# Patient Record
Sex: Female | Born: 1990 | Race: White | Hispanic: No | Marital: Married | Smoking: Never smoker
Health system: Southern US, Community
[De-identification: ages and names within clinical notes are randomized; demographics above are authoritative.]

## PROBLEM LIST (undated history)

## (undated) DIAGNOSIS — C719 Malignant neoplasm of brain, unspecified: Secondary | ICD-10-CM

## (undated) DIAGNOSIS — B2790 Infectious mononucleosis, unspecified without complication: Secondary | ICD-10-CM

## (undated) DIAGNOSIS — R55 Syncope and collapse: Secondary | ICD-10-CM

## (undated) DIAGNOSIS — R569 Unspecified convulsions: Secondary | ICD-10-CM

## (undated) HISTORY — DX: Unspecified convulsions: R56.9

## (undated) HISTORY — DX: Syncope and collapse: R55

## (undated) HISTORY — DX: Infectious mononucleosis, unspecified without complication: B27.90

## (undated) HISTORY — PX: TONSILLECTOMY: SHX5217

## (undated) HISTORY — DX: Malignant neoplasm of brain, unspecified: C71.9

## (undated) HISTORY — PX: CRANIOTOMY: SHX93

---
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
## 2012-02-03 ENCOUNTER — Encounter: Payer: Self-pay | Admitting: Family

## 2012-02-03 ENCOUNTER — Ambulatory Visit (INDEPENDENT_AMBULATORY_CARE_PROVIDER_SITE_OTHER): Payer: BC Managed Care – PPO | Admitting: Family

## 2012-02-03 VITALS — BP 120/80 | HR 100 | Temp 97.9°F | Resp 16 | Wt 190.0 lb

## 2012-02-03 DIAGNOSIS — R55 Syncope and collapse: Secondary | ICD-10-CM

## 2012-02-03 NOTE — Patient Instructions (Signed)
Syncope You have had a fainting (syncopal) spell. A fainting episode is a sudden, short-lived loss of consciousness. It results in complete recovery. It occurs because there has been a temporary shortage of oxygen and/or sugar (glucose) to the brain. CAUSES   Blood pressure pills and other medications that may lower blood pressure below normal. Sudden changes in posture (sudden standing).   Over-medication. Take your medications as directed.   Standing too long. This can cause blood to pool in the legs.   Seizure disorders.   Low blood sugar (hypoglycemia) of diabetes. This more commonly causes coma.   Bearing down to go to the bathroom. This can cause your blood pressure to rise suddenly. Your body compensates by making the blood pressure too low when you stop bearing down.   Hardening of the arteries where the brain temporarily does not receive enough blood.   Irregular heart beat and circulatory problems.   Fear, emotional distress, injury, sight of blood, or illness.  Your caregiver will send you home if the syncope was from non-worrisome causes (benign). Depending on your age and health, you may stay to be monitored and observed. If you return home, have someone stay with you if your caregiver feels that is desirable. It is very important to keep all follow-up referrals and appointments in order to properly manage this condition. This is a serious problem which can lead to serious illness and death if not carefully managed.  WARNING: Do not drive or operate machinery until your caregiver feels that it is safe for you to do so. SEEK IMMEDIATE MEDICAL CARE IF:   You have another fainting episode or faint while lying or sitting down. DO NOT DRIVE YOURSELF. Call 911 if no other help is available.   You have chest pain, are feeling sick to your stomach (nausea), vomiting or abdominal pain.   You have an irregular heartbeat or one that is very fast (pulse over 120 beats per minute).    You have a loss of feeling in some part of your body or lose movement in your arms or legs.   You have difficulty with speech, confusion, severe weakness, or visual problems.   You become sweaty and/or feel light headed.  Make sure you are rechecked as instructed. Document Released: 10/10/2005 Document Revised: 09/29/2011 Document Reviewed: 05/31/2007 Exit[employer] Patient Information 2012 [employer]. 

## 2012-02-03 NOTE — Progress Notes (Signed)
  Subjective:    Patient ID: Stacy Gutierrez, female    DOB: [birth year], 21 y.o.   MRN: [id number]  HPI  Pt is an 21 yr old female who presents today s/p syncopal event.  Reports that she woke this AM with nausea.  Denies associated vomiting or fever. Denies nasal congestion.  Does feel "a little achey."  Tol po's.  Reports that this episode occurred when she was seated in class.  She slumped to her right per hx and was passed out for a few seconds only.      Review of Systems See HPI  Past Medical History  Diagnosis Date  . Vasovagal syncope   . Mononucleosis 01/2010    History   Social History  . Marital Status: Single    Spouse Name: N/A    Number of Children: 0  . Years of Education: N/A   Occupational History  . Student    Social History Main Topics  . Smoking status: Never Smoker   . Smokeless tobacco: Never Used  . Alcohol Use: No  . Drug Use: Not on file  . Sexually Active: No   Other Topics Concern  . Not on file   Social History Narrative   Occupation: [employer] [employer], golfthird degree black belt in [employer]    Past Surgical History  Procedure Date  . Tonsillectomy   . Tibia fracture surgery 03/2010    Tibial Fracture-left leg--plate/screw--Dr. Samuel Bouche    Family History  Problem Relation Age of Onset  . Coronary artery disease Other   . Diabetes Other   . Hyperlipidemia Other   . Stroke Other     No Known Allergies  Current Outpatient Prescriptions on File Prior to Visit  Medication Sig Dispense Refill  . CLARAVIS 40 MG capsule Take 40 mg by mouth daily.      . fluticasone (FLONASE) 50 MCG/ACT nasal spray Place 2 sprays into the nose daily.  16 g  1  . loratadine-pseudoephedrine (CLARITIN-D 12-HOUR) 5-120 MG per tablet Take 1 tablet by mouth 2 (two) times daily.        BP 120/80  Pulse 100  Temp(Src) 97.9 F (36.6 C) (Oral)  Resp 16  Wt 190 lb (86.183 kg)  SpO2 99%  LMP  01/20/2012       Objective:   Physical Exam  Constitutional: She appears well-developed and well-nourished. No distress.  HENT:  Head: Normocephalic and atraumatic.  Cardiovascular: Normal rate and regular rhythm.   No murmur heard. Pulmonary/Chest: Effort normal and breath sounds normal.  Abdominal: Soft. Bowel sounds are normal. She exhibits no distension and no mass. There is no tenderness. There is no rebound and no guarding.  Psychiatric: She has a normal mood and affect. Her behavior is normal. Judgment and thought content normal.          Assessment & Plan:

## 2012-02-03 NOTE — Assessment & Plan Note (Signed)
Previous episode back in 2010 when she was watching an film about intestines.  EKG is personally reviewed and it shows NSR.  She later tells me that she noted a "red stool" prior to this episode.  I recommended a rectal exam and hemocult but she refused today.  Instructed pt to let us know if she has any further red stools.  She may be developing a viral gastroenteritis as well.  Recommended that she try to rehydrate today.  Pt verbalizes understanding.

## 2012-03-29 ENCOUNTER — Telehealth: Payer: Self-pay | Admitting: Family

## 2012-03-29 NOTE — Telephone Encounter (Signed)
Patients mom Annice Pih called stating that [place] is requesting immunization records on patient.   Annice Pih states that records need to be faxed by tomorrow afternoon because patient will be leaving for out of the country [place]. Annice Pih also requests that we call her to let her know that records have been faxed.   ATTN: Financial trader at [employer] Fax: [phone]

## 2012-03-30 NOTE — Telephone Encounter (Signed)
Notified pt's mom that we do not have past immunizations on file. Will fax what we do have and she will contact pt's high school or past pediatrician for previous immunizations.

## 2013-09-24 ENCOUNTER — Telehealth: Payer: Self-pay | Admitting: *Deleted

## 2013-09-24 NOTE — Telephone Encounter (Signed)
Received message from pt's mom, Annice Pih that pt is completing some forms for school and they need to know the date of pt's last CPE.  Left detailed message that last CPE was 08/30/11 and to call if any further questions.

## 2016-03-09 ENCOUNTER — Ambulatory Visit (INDEPENDENT_AMBULATORY_CARE_PROVIDER_SITE_OTHER): Payer: BLUE CROSS/BLUE SHIELD | Admitting: Family Medicine

## 2016-03-09 ENCOUNTER — Other Ambulatory Visit (HOSPITAL_COMMUNITY)
Admission: RE | Admit: 2016-03-09 | Discharge: 2016-03-09 | Disposition: A | Discharge status: Home / Self Care | Payer: BLUE CROSS/BLUE SHIELD | Source: Ambulatory Visit | Attending: Family Medicine | Admitting: Family Medicine

## 2016-03-09 ENCOUNTER — Encounter: Payer: Self-pay | Admitting: Family Medicine

## 2016-03-09 VITALS — BP 125/75 | HR 80 | Temp 98.1°F | Ht 65.0 in | Wt 197.2 lb

## 2016-03-09 DIAGNOSIS — N760 Acute vaginitis: Secondary | ICD-10-CM | POA: Insufficient documentation

## 2016-03-09 DIAGNOSIS — R8290 Unspecified abnormal findings in urine: Secondary | ICD-10-CM | POA: Diagnosis not present

## 2016-03-09 DIAGNOSIS — N898 Other specified noninflammatory disorders of vagina: Secondary | ICD-10-CM

## 2016-03-09 DIAGNOSIS — J309 Allergic rhinitis, unspecified: Secondary | ICD-10-CM

## 2016-03-09 DIAGNOSIS — L298 Other pruritus: Secondary | ICD-10-CM | POA: Diagnosis not present

## 2016-03-09 LAB — POCT URINALYSIS DIPSTICK
Bilirubin, UA: NEGATIVE
GLUCOSE UA: NEGATIVE
KETONES UA: NEGATIVE
Nitrite, UA: NEGATIVE
PROTEIN UA: NEGATIVE
SPEC GRAV UA: 1.02
Urobilinogen, UA: 4
pH, UA: 7.5

## 2016-03-09 LAB — URINALYSIS, MICROSCOPIC ONLY

## 2016-03-09 MED ORDER — FLUCONAZOLE 150 MG PO TABS: 150.0000 mg | ORAL_TABLET | Freq: Every day | ORAL | Status: DC

## 2016-03-09 NOTE — Progress Notes (Signed)
Pre visit review using our clinic review tool, if applicable. No additional management support is needed unless otherwise documented below in the visit note. 

## 2016-03-09 NOTE — Progress Notes (Addendum)
[place] [facility] [address] [place], [place] [address] [phone]  Date:  03/09/2016   Name:  Stacy Gutierrez   DOB:  [birth year]   MRN:  [id number]  PCP:  Nance Pear., NP    Chief Complaint: Vaginitis   History of Present Illness:  Stacy Gutierrez is a 25 y.o. very pleasant female patient who presents with the following:  Generally healthy young lady here today with concern of itchy, irirtaed vulva with white discharge.  She noted itching a week ago, discharge 2 days ago She did not try anything OTC  No dysuria. No blood in her urine No fever, belly pain, vomiting  LMP: 3 weeks ago Otherwise feels well except for a "head cold for 2 weeks" with itchy runny nose and sneezing  Patient Active Problem List   Diagnosis Date Noted  . General medical examination 08/30/2011  . SYNCOPE, VASOVAGAL 08/25/2009  . RHINOSINUSITIS, ACUTE 08/19/2009    Past Medical History  Diagnosis Date  . Vasovagal syncope   . Mononucleosis 01/2010    Past Surgical History  Procedure Laterality Date  . Tonsillectomy    . Tibia fracture surgery  03/2010    Tibial Fracture-left leg--plate/screw--Dr. Linton Rump    Social History  Substance Use Topics  . Smoking status: Never Smoker   . Smokeless tobacco: Never Used  . Alcohol Use: No    Family History  Problem Relation Age of Onset  . Coronary artery disease Other   . Diabetes Other   . Hyperlipidemia Other   . Stroke Other     No Known Allergies  Medication list has been reviewed and updated.  No current outpatient prescriptions on file prior to visit.   No current facility-administered medications on file prior to visit.    Review of Systems:  As per HPI- otherwise negative.   Physical Examination: Filed Vitals:   03/09/16 0916  BP: 125/75  Pulse: 80  Temp: 98.1 F (36.7 C)   Filed Vitals:   03/09/16 0916  Height: 5\' 5"  (1.651 m)  Weight: 197 lb 3.2 oz  (89.449 kg)   Body mass index is 32.82 kg/(m^2). Ideal Body Weight: Weight in (lb) to have BMI = 25: 149.9  GEN: WDWN, NAD, Non-toxic, A & O x 3, overweight, looks well HEENT: Atraumatic, Normocephalic. Neck supple. No masses, No LAD.  Bilateral TM wnl, oropharynx normal.  PEERL,EOMI.   Stringy nasal mucus Ears and Nose: No external deformity. CV: RRR, No M/G/R. No JVD. No thrill. No extra heart sounds. PULM: CTA B, no wheezes, crackles, rhonchi. No retractions. No resp. distress. No accessory muscle use. ABD: S, NT, ND. No rebound. No HSM. EXTR: No c/c/e NEURO Normal gait.  PSYCH: Normally interactive. Conversant. Not depressed or anxious appearing.  Calm demeanor.  Pelvic: normal except for evidence of irritation and some thick white discharge. Uterus normal, no CMT, no adnexal tendereness or masses  Assessment and Plan: Vaginal itching - Plan: Cervicovaginal ancillary only, GC/Chlamydia Probe Amp, fluconazole (DIFLUCAN) 150 MG tablet  Abnormal urinalysis - Plan: POCT urinalysis dipstick, Urine Microscopic Only, Urine culture  Allergic rhinitis, unspecified allergic rhinitis type   Here today with likely yeast vaginitis.  Will treat with diflucan, labs pending Noted to have luekocytes on her UA- will check a micro and culture She also noted sx of runny nose and itching for a couple of weeks Recommended that she try an OTC antihistamine such as claritin  for AR  Signed Lamar Blinks, MD  Received some of her labs 5/18- await the rest of her results   Called pt on 5/20- no answer.  Will send letter  Results for orders placed or performed in visit on 03/09/16  GC/Chlamydia Probe Amp  Result Value Ref Range   CT Probe RNA NOT DETECTED    GC Probe RNA NOT DETECTED   Urine culture  Result Value Ref Range   Colony Count 8,000 COLONIES/ML    Organism ID, Bacteria Insignificant Growth   Urine Microscopic Only  Result Value Ref Range   WBC, UA 0-2/hpf 0-2/hpf   RBC / HPF  3-6/hpf (A) 0-2/hpf   Squamous Epithelial / LPF Rare(0-4/hpf) Rare(0-4/hpf)  POCT urinalysis dipstick  Result Value Ref Range   Color, UA Yellow    Clarity, UA Clear    Glucose, UA Negative    Bilirubin, UA Negative    Ketones, UA Negative    Spec Grav, UA 1.020    Blood, UA Trace    pH, UA 7.5    Protein, UA Negative    Urobilinogen, UA 4.0    Nitrite, UA Negative    Leukocytes, UA moderate (2+) (A) Negative  Cervicovaginal ancillary only  Result Value Ref Range   Wet Prep (BD Affirm) **POSITIVE for Gardnerella POSITIVE for Candida** (A)

## 2016-03-09 NOTE — Patient Instructions (Signed)
We are going to treat you for a vaginal yeast infection with the oral diflucan pill Let me know if this does not get rid of your symptoms I will be in touch with the rest of your labs

## 2016-03-10 LAB — URINE CULTURE: Colony Count: 8000

## 2016-03-10 LAB — GC/CHLAMYDIA PROBE AMP
CT Probe RNA: NOT DETECTED
GC PROBE AMP APTIMA: NOT DETECTED

## 2016-03-10 LAB — CERVICOVAGINAL ANCILLARY ONLY: Wet Prep (BD Affirm): POSITIVE — AB

## 2016-03-12 ENCOUNTER — Ambulatory Visit (INDEPENDENT_AMBULATORY_CARE_PROVIDER_SITE_OTHER): Payer: BLUE CROSS/BLUE SHIELD | Admitting: Family Medicine

## 2016-03-12 ENCOUNTER — Encounter: Payer: Self-pay | Admitting: Family Medicine

## 2016-03-12 VITALS — BP 122/80 | HR 70 | Temp 97.8°F | Wt 197.0 lb

## 2016-03-12 DIAGNOSIS — J019 Acute sinusitis, unspecified: Secondary | ICD-10-CM | POA: Diagnosis not present

## 2016-03-12 DIAGNOSIS — J329 Chronic sinusitis, unspecified: Secondary | ICD-10-CM | POA: Insufficient documentation

## 2016-03-12 MED ORDER — AMOXICILLIN-POT CLAVULANATE 875-125 MG PO TABS: 1.0000 | ORAL_TABLET | Freq: Two times a day (BID) | ORAL | Status: DC

## 2016-03-12 NOTE — Progress Notes (Signed)
   Subjective:  Patient ID: Stacy Gutierrez, female    DOB: 07-14-1991  Age: 25 y.o. MRN: [id number]  CC: Sinus congestion, cough, nosebleeds  HPI:  25 year old female presents with the above complaints.  Patient states that she's not been feeling well for the past 3 weeks. She's been experiencing sinus congestion and productive cough. She states that she's also been having nasal discharge. Additionally, she's had recent nosebleeds. She states that this usually happens when she blows her nose frequently. No associated fever or chills. No exacerbating or relieving factors. No interventions tried.  Social Hx   Social History   Social History  . Marital Status: Single    Spouse Name: N/A  . Number of Children: 0  . Years of Education: N/A   Occupational History  . Student    Social History Main Topics  . Smoking status: Never Smoker   . Smokeless tobacco: Never Used  . Alcohol Use: No  . Drug Use: None  . Sexual Activity: No   Other Topics Concern  . 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  HENT: Positive for congestion and sinus pressure.   Respiratory: Positive for cough.    Objective:  BP 122/80 mmHg  Pulse 70  Temp(Src) 97.8 F (36.6 C) (Oral)  Wt 197 lb (89.359 kg)  LMP 03/11/2016  BP/Weight 03/12/2016 03/09/2016 0000000  Systolic BP 123XX123 [id number]  Diastolic BP 80 75 80  Wt. (Lbs) 197 197.2 190  BMI 32.78 32.82 -   Physical Exam  Constitutional: She is oriented to person, place, and time. She appears well-developed. No distress.  HENT:  Mouth/Throat: Oropharynx is clear and moist.  Normal TMs bilaterally. Active nosebleed from the right nostril. Resolving with compression/pressure.  Cardiovascular: Normal rate and regular rhythm.   Pulmonary/Chest: Effort  normal. She has no wheezes. She has no rales.  Neurological: She is alert and oriented to person, place, and time.  Psychiatric: She has a normal mood and affect.  Vitals reviewed.  Lab Results  Component Value Date   WBC 12.9 02/11/2010   HGB 14.1 02/11/2010   HCT 42.5 02/11/2010   PLT 310 02/11/2010   GLUCOSE 82 02/11/2010   ALT 32 02/11/2010   AST 24 02/11/2010   NA 138 02/11/2010   K 4.3 02/11/2010   CL 100 02/11/2010   CREATININE 0.83 02/11/2010   BUN 13 02/11/2010   CO2 25 02/11/2010    Assessment & Plan:   Problem List Items Addressed This Visit    Sinusitis - Primary    New acute problem. Treating with Augmentin. Advised nasal saline regarding nosebleeds.      Relevant Medications   amoxicillin-clavulanate (AUGMENTIN) 875-125 MG tablet      Meds ordered this encounter  Medications  . amoxicillin-clavulanate (AUGMENTIN) 875-125 MG tablet    Sig: Take 1 tablet by mouth 2 (two) times daily.    Dispense:  20 tablet    Refill:  0    Follow-up: PRN  [place]

## 2016-03-12 NOTE — Progress Notes (Signed)
Pre visit review using our clinic review tool, if applicable. No additional management support is needed unless otherwise documented below in the visit note. 

## 2016-03-12 NOTE — Assessment & Plan Note (Signed)
New acute problem. Treating with Augmentin. Advised nasal saline regarding nosebleeds.

## 2016-03-12 NOTE — Patient Instructions (Signed)
Take the Augmentin as prescribed.  Use nasal saline twice daily to help with the nosebleeds.  Follow up as needed.  Take care  Dr. Lacinda Axon

## 2019-01-25 ENCOUNTER — Ambulatory Visit: Payer: BLUE CROSS/BLUE SHIELD | Admitting: Family

## 2019-01-29 ENCOUNTER — Telehealth: Payer: Self-pay | Admitting: Family

## 2019-01-29 MED ORDER — ATOVAQUONE-PROGUANIL HCL 250-100 MG PO TABS: ORAL_TABLET | ORAL | 0 refills | Status: DC

## 2019-01-29 MED ORDER — TYPHOID VACCINE PO CPDR: DELAYED_RELEASE_CAPSULE | ORAL | 0 refills | Status: DC

## 2019-01-29 NOTE — Telephone Encounter (Signed)
Patient's mom called requesting rx for upcoming visit to [place].  They are not sure that they are going to go due to Covid.  I have advised them against travel.  They plan to come in to start the Twinrix series and I will send malarone and vivotif. They plan to obtain yellow fever vaccine through the health department.

## 2019-02-22 ENCOUNTER — Ambulatory Visit: Payer: BLUE CROSS/BLUE SHIELD

## 2019-02-22 ENCOUNTER — Ambulatory Visit: Payer: BLUE CROSS/BLUE SHIELD | Admitting: Family

## 2019-02-28 ENCOUNTER — Ambulatory Visit: Payer: BLUE CROSS/BLUE SHIELD

## 2019-03-15 ENCOUNTER — Ambulatory Visit: Payer: BLUE CROSS/BLUE SHIELD

## 2019-05-28 ENCOUNTER — Ambulatory Visit: Payer: BLUE CROSS/BLUE SHIELD

## 2019-06-04 ENCOUNTER — Ambulatory Visit: Payer: BLUE CROSS/BLUE SHIELD

## 2019-06-21 ENCOUNTER — Ambulatory Visit: Payer: BLUE CROSS/BLUE SHIELD

## 2019-08-21 ENCOUNTER — Other Ambulatory Visit: Payer: Self-pay

## 2019-08-21 DIAGNOSIS — Z20828 Contact with and (suspected) exposure to other viral communicable diseases: Secondary | ICD-10-CM | POA: Diagnosis not present

## 2019-08-21 DIAGNOSIS — Z20822 Contact with and (suspected) exposure to covid-19: Secondary | ICD-10-CM

## 2019-08-22 LAB — NOVEL CORONAVIRUS, NAA: SARS-CoV-2, NAA: DETECTED — AB

## 2019-09-29 ENCOUNTER — Encounter: Payer: Self-pay | Admitting: Family

## 2019-10-21 DIAGNOSIS — C719 Malignant neoplasm of brain, unspecified: Secondary | ICD-10-CM | POA: Diagnosis not present

## 2019-12-18 ENCOUNTER — Encounter: Payer: Self-pay | Admitting: Family

## 2020-01-31 ENCOUNTER — Other Ambulatory Visit: Payer: Self-pay

## 2020-01-31 ENCOUNTER — Ambulatory Visit: Payer: BC Managed Care – PPO | Admitting: Medical

## 2020-01-31 ENCOUNTER — Encounter: Payer: Self-pay | Admitting: Medical

## 2020-01-31 VITALS — BP 121/73 | HR 76 | Resp 18 | Ht 65.0 in | Wt 175.6 lb

## 2020-01-31 DIAGNOSIS — Z23 Encounter for immunization: Secondary | ICD-10-CM

## 2020-01-31 DIAGNOSIS — Z7689 Persons encountering health services in other specified circumstances: Secondary | ICD-10-CM

## 2020-01-31 DIAGNOSIS — Z7184 Encounter for health counseling related to travel: Secondary | ICD-10-CM

## 2020-01-31 NOTE — Progress Notes (Signed)
   Subjective:    Patient ID: Stacy Gutierrez, female    DOB: 1991/06/11, 29 y.o.   MRN: [id number]  HPI  Pt about to leave for [place]  May 18 or 18 th.  Pt in for Tdap. Last one was in 2007.  Pt did get series of hep b in past.  She only got one hep A vaccine  11 years ago.  Pt already got the Malarone and vivotif rx sent to pharmacy.   Pt will likely need yellow fever vaccine also.          Review of Systems     Objective:   Physical Exam  General- No acute distress. Pleasant patient. Neck- Full range of motion, no jvd Lungs- Clear, even and unlabored. Heart- regular rate and rhythm. Neurologic- CNII- XII grossly intact.       Assessment & Plan:  For your upcoming trip start malarone and vivotif as rx'd.  Get yellow fever vaccine at travel clinic in [place].  Tdap and hep A vaccine today.   Epic review up to date on hep b series  Follow up 6 months for hep A for 2nd hep A vaccine in series.  Time spent with patient today was 25  minutes which consisted review of chart, discussion on required vaccines, answering questions and documentation  Mackie Pai, PA-C

## 2020-01-31 NOTE — Patient Instructions (Addendum)
For your upcoming trip start malarone and vivotif as rx'd.  Get yellow fever vaccine at travel clinic in [place].  Tdap and hep A vaccine today.   Epic review up to date on hep b series  Follow up 6 months for hep A for 2nd hep A vaccine in series.

## 2020-02-03 ENCOUNTER — Ambulatory Visit: Payer: BC Managed Care – PPO | Admitting: Medical

## 2020-02-03 ENCOUNTER — Telehealth: Payer: Self-pay | Admitting: Medical

## 2020-02-03 ENCOUNTER — Other Ambulatory Visit: Payer: Self-pay

## 2020-02-03 ENCOUNTER — Ambulatory Visit (HOSPITAL_BASED_OUTPATIENT_CLINIC_OR_DEPARTMENT_OTHER)
Admission: RE | Admit: 2020-02-03 | Discharge: 2020-02-03 | Disposition: A | Discharge status: Home / Self Care | Payer: BC Managed Care – PPO | Source: Ambulatory Visit | Attending: Medical | Admitting: Medical

## 2020-02-03 VITALS — BP 119/71 | HR 74 | Temp 97.0°F | Resp 18 | Ht 65.0 in | Wt 178.0 lb

## 2020-02-03 DIAGNOSIS — M25531 Pain in right wrist: Secondary | ICD-10-CM

## 2020-02-03 DIAGNOSIS — M79631 Pain in right forearm: Secondary | ICD-10-CM | POA: Diagnosis not present

## 2020-02-03 DIAGNOSIS — S59911A Unspecified injury of right forearm, initial encounter: Secondary | ICD-10-CM | POA: Diagnosis not present

## 2020-02-03 IMAGING — DX DG WRIST COMPLETE 3+V*R*
4 series · 4 of 4 positions shown · non-contrast
Comparison: None.

CLINICAL DATA: Right wrist pain after fall

EXAM:
RIGHT WRIST - COMPLETE 3+ VIEW

[wrist pa]
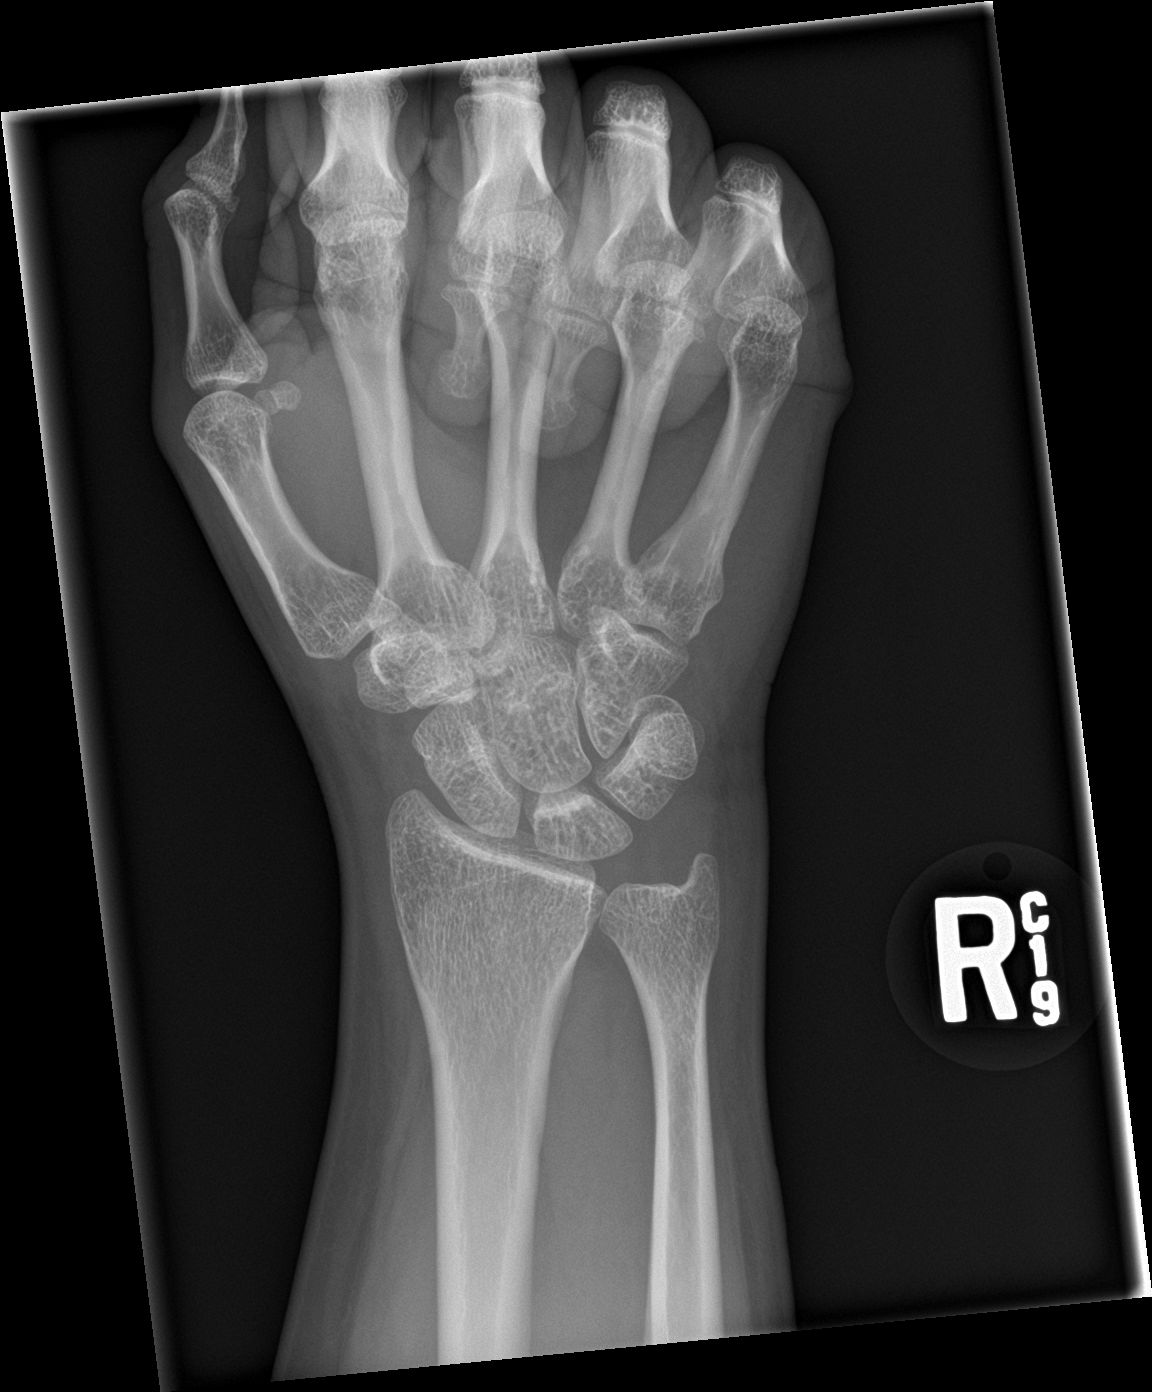

[wrist obl]
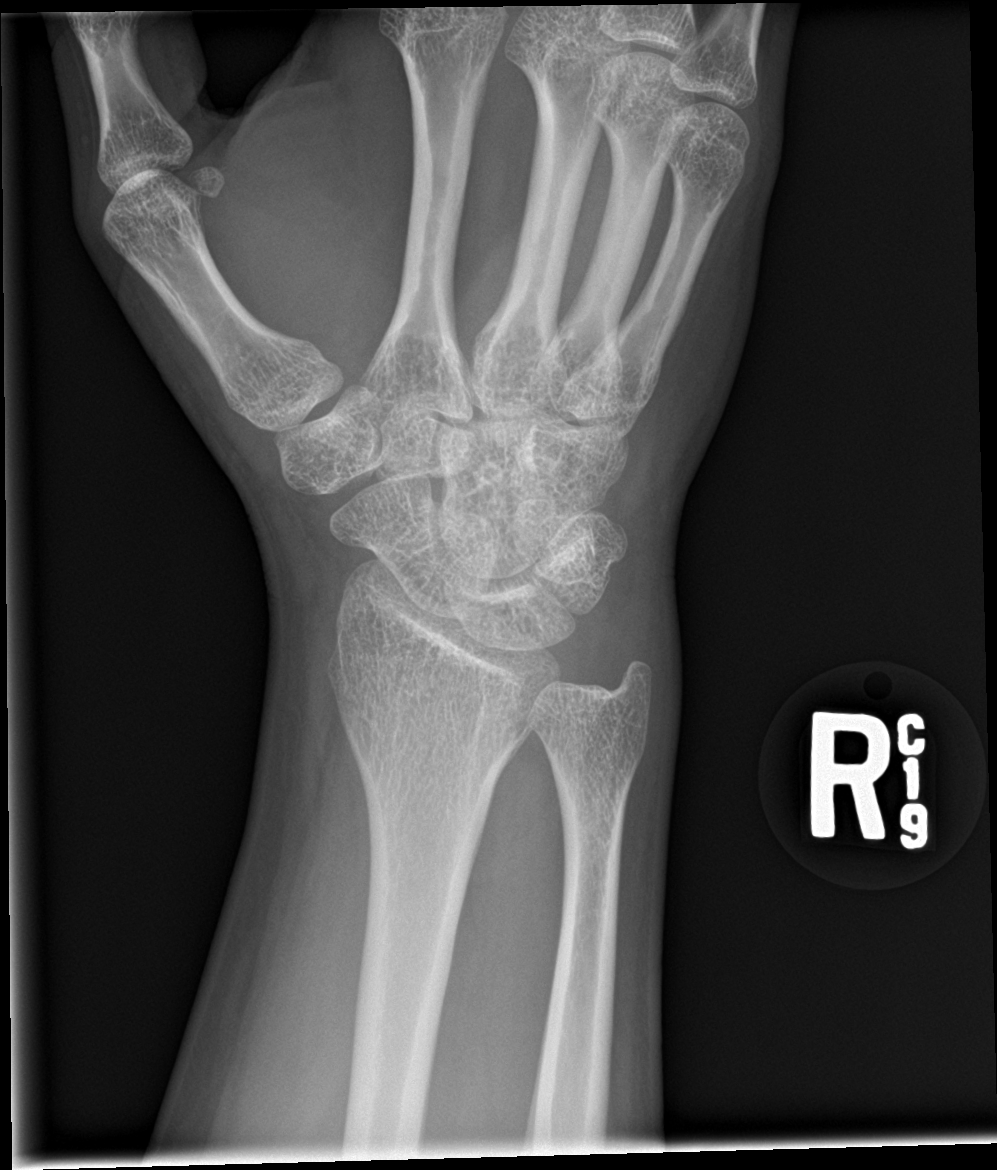

[wrist lat]
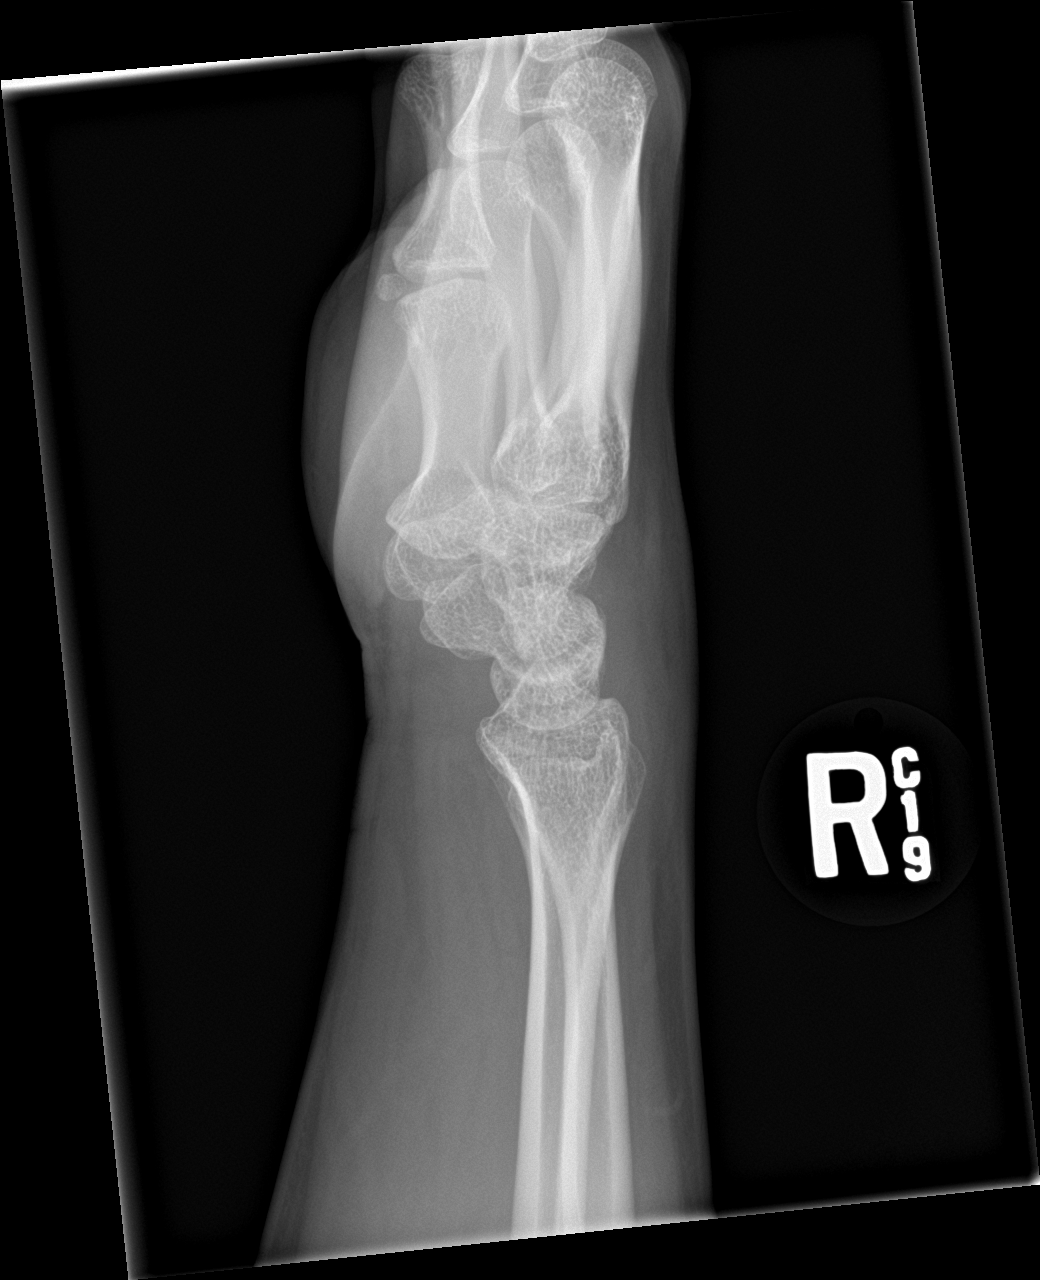

[wrist navicular]
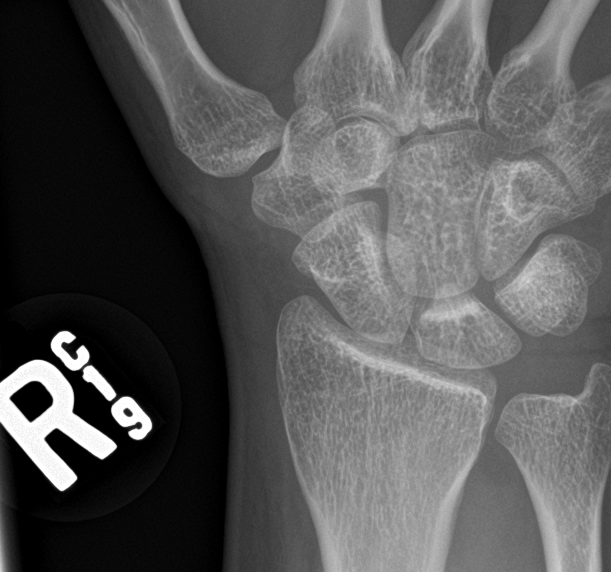

[4 of 4 positions shown; findings below may reference images not displayed]

FINDINGS: Possible tiny triquetral avulsion fracture. Osseous structures
appear otherwise intact. Carpal alignment is maintained without
dislocation. There is no evidence of arthropathy or other focal bone
abnormality. Mild soft tissue swelling of the dorsal and ulnar
aspects of the wrist.
IMPRESSION: 1. Possible tiny triquetral avulsion fracture. Correlate with point
tenderness.
2. Mild soft tissue swelling of the dorsal and ulnar aspects of the
wrist.

## 2020-02-03 IMAGING — DX DG FOREARM 2V*R*
2 series · 2 of 2 positions shown · non-contrast
Comparison: None

CLINICAL DATA: Fell roller skating yesterday, RIGHT forearm pain

EXAM:
RIGHT FOREARM - 2 VIEW

[forearm ap]
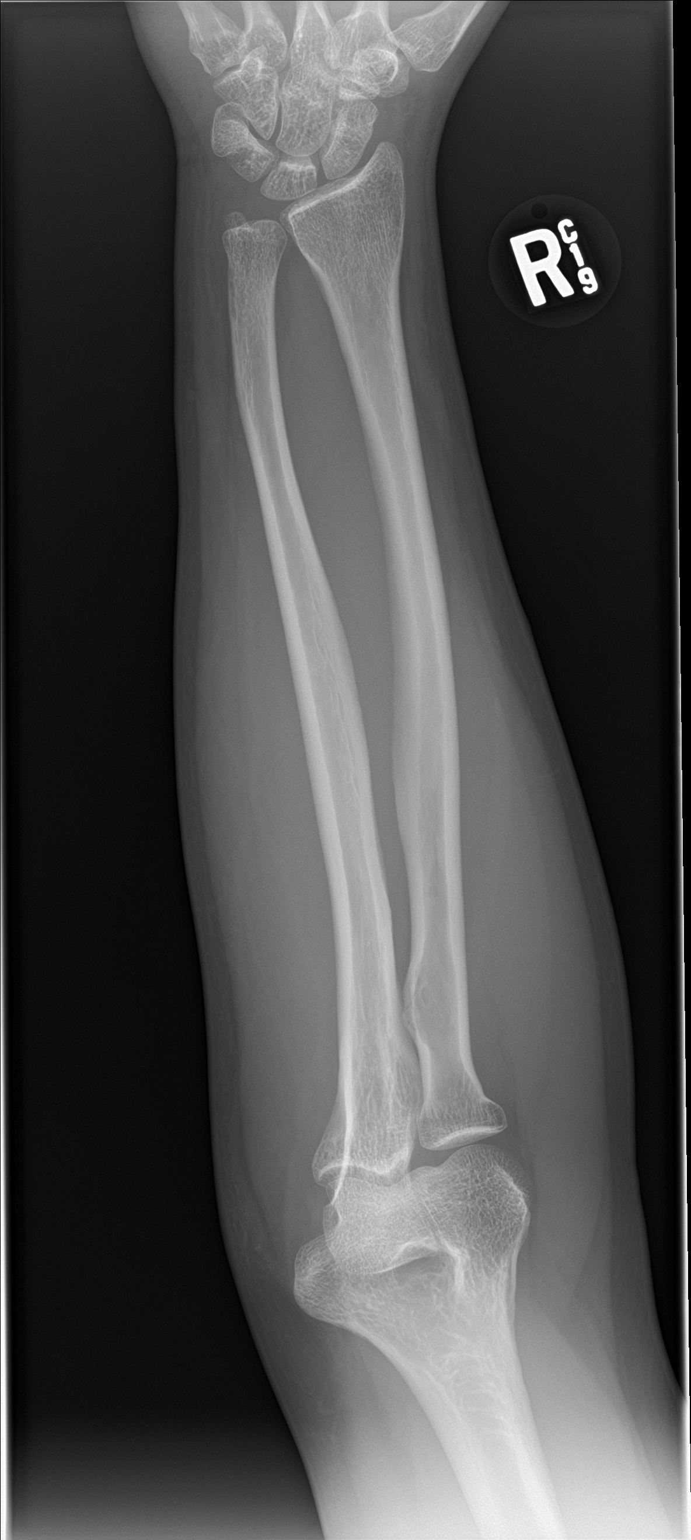

[forearm lat]
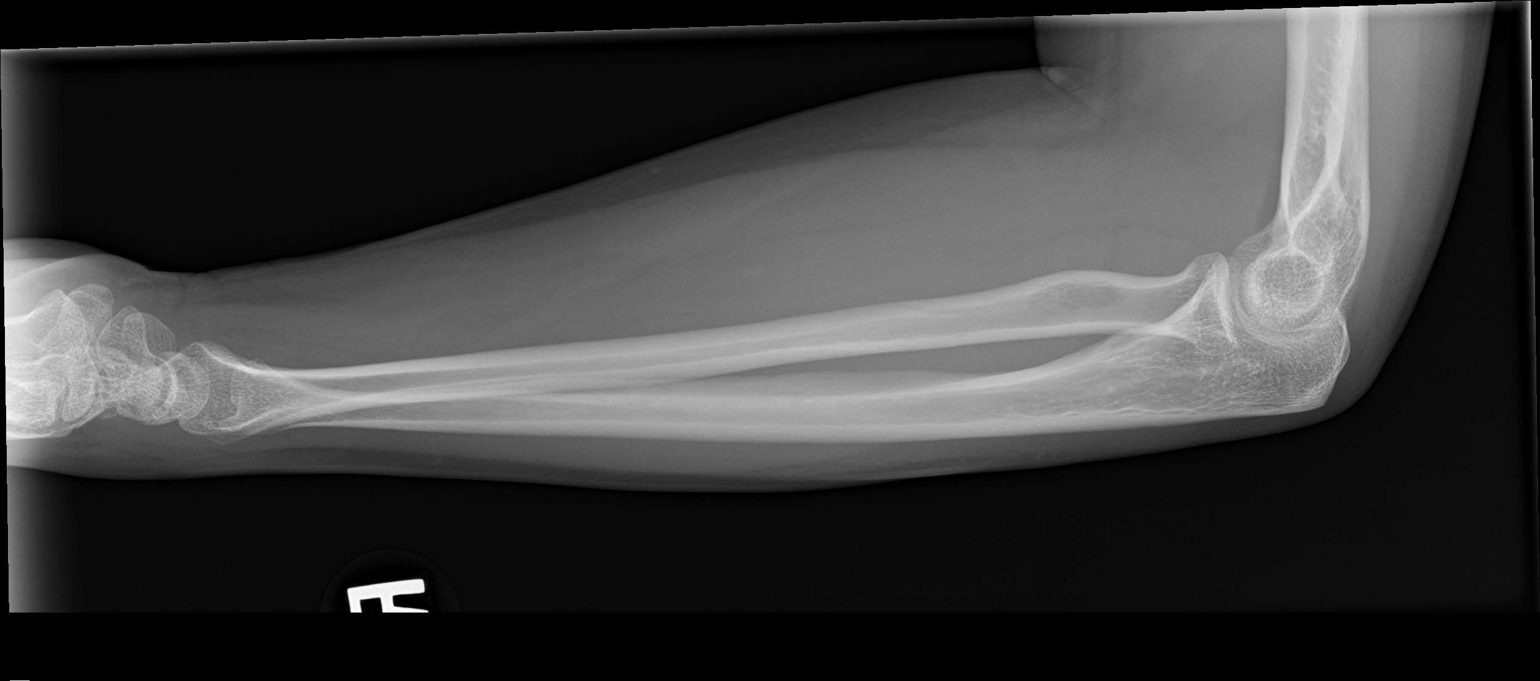

[2 of 2 positions shown; findings below may reference images not displayed]

FINDINGS: Osseous mineralization normal.

Wrist and elbow joint alignments normal.

No acute fracture, dislocation, or bone destruction.

Soft tissue swelling identified overlying the dorsum of the carpus.
IMPRESSION: No acute osseous abnormalities.

## 2020-02-03 MED ORDER — MELOXICAM 7.5 MG PO TABS: 7.5000 mg | ORAL_TABLET | Freq: Every day | ORAL | 0 refills | Status: DC

## 2020-02-03 NOTE — Progress Notes (Signed)
   Subjective:    Patient ID: Stacy Gutierrez, female    DOB: July 19, 1991, 29 y.o.   MRN: [id number]  HPI  Pt in for wrist pain. She states fell yesterday roller blading. She fell and caught her fall. She was wearing wrist guards. 4 hours after fall landed on her wrist.   Flexing and extending wrist and pronating/supinating wrist hurts.   Pt took advil 400 mg and it helped just little bit.  lmp- iud.    Review of Systems  Constitutional: Negative for chills, fatigue and fever.  Musculoskeletal:       Rt forearm pain rt wrist  pain. No hand pain       Objective:   Physical Exam  General- no acute distress. Rt forearm and wrist- mid forearm tenderness to palpation. Rt wrist- pain on flexion and extension. Also some on pronation and supination. No snuff box tenderness.        Assessment & Plan:  For rt wrist and mid forearm pain after fall will get xray of both area. Wrist cock up splint given. Rx meloxicam for pain and inflammation.  Will follow xray and see if fracture.  If no fracture still recommend follow up in one week to assess if need repeat xray for tiny fracture not seen or soft tissue injury.  Follow up 7 days or as needed  Gave option to get xray done at [facility] [address]. Pt declined prefers here.  Time spent with patient today was 25  minutes which discussing diagnosis, work up, treatment and documentation.

## 2020-02-03 NOTE — Telephone Encounter (Signed)
Referral to sports medicine placed

## 2020-02-03 NOTE — Patient Instructions (Addendum)
For rt wrist and mid forearm pain after fall will get xray of both area. Wrist cock up splint given. Rx meloxicam for pain and inflammation.  Will follow xray and see if fracture.  If no fracture still recommend follow up in one week to assess if need repeat xray for tiny fracture not seen or soft tissue injury.   Follow up 7 days or as needed

## 2020-02-03 NOTE — Telephone Encounter (Signed)
Opened to refer,

## 2020-02-17 DIAGNOSIS — Z85841 Personal history of malignant neoplasm of brain: Secondary | ICD-10-CM | POA: Diagnosis not present

## 2020-02-17 DIAGNOSIS — C719 Malignant neoplasm of brain, unspecified: Secondary | ICD-10-CM | POA: Diagnosis not present

## 2020-06-01 DIAGNOSIS — C719 Malignant neoplasm of brain, unspecified: Secondary | ICD-10-CM | POA: Diagnosis not present

## 2020-08-07 DIAGNOSIS — R569 Unspecified convulsions: Secondary | ICD-10-CM | POA: Diagnosis not present

## 2020-08-07 DIAGNOSIS — C719 Malignant neoplasm of brain, unspecified: Secondary | ICD-10-CM | POA: Diagnosis not present

## 2020-08-07 DIAGNOSIS — G43019 Migraine without aura, intractable, without status migrainosus: Secondary | ICD-10-CM | POA: Diagnosis not present

## 2020-09-21 DIAGNOSIS — C719 Malignant neoplasm of brain, unspecified: Secondary | ICD-10-CM | POA: Diagnosis not present

## 2021-03-01 DIAGNOSIS — C719 Malignant neoplasm of brain, unspecified: Secondary | ICD-10-CM | POA: Diagnosis not present

## 2021-03-01 DIAGNOSIS — C718 Malignant neoplasm of overlapping sites of brain: Secondary | ICD-10-CM | POA: Diagnosis not present

## 2021-05-25 ENCOUNTER — Encounter: Payer: Self-pay | Admitting: Family

## 2021-05-25 ENCOUNTER — Ambulatory Visit (INDEPENDENT_AMBULATORY_CARE_PROVIDER_SITE_OTHER): Payer: BC Managed Care – PPO | Admitting: Family

## 2021-05-25 ENCOUNTER — Ambulatory Visit (INDEPENDENT_AMBULATORY_CARE_PROVIDER_SITE_OTHER): Payer: BC Managed Care – PPO | Admitting: Nurse Practitioner

## 2021-05-25 ENCOUNTER — Other Ambulatory Visit: Payer: Self-pay

## 2021-05-25 ENCOUNTER — Other Ambulatory Visit (HOSPITAL_COMMUNITY)
Admission: RE | Admit: 2021-05-25 | Discharge: 2021-05-25 | Disposition: A | Discharge status: Home / Self Care | Payer: BC Managed Care – PPO | Source: Ambulatory Visit | Attending: Nurse Practitioner | Admitting: Nurse Practitioner

## 2021-05-25 ENCOUNTER — Encounter: Payer: Self-pay | Admitting: Nurse Practitioner

## 2021-05-25 VITALS — BP 110/68 | HR 78 | Resp 18 | Ht 64.86 in | Wt 192.4 lb

## 2021-05-25 VITALS — BP 123/71 | HR 68 | Temp 98.7°F | Resp 16 | Ht 65.0 in | Wt 192.0 lb

## 2021-05-25 DIAGNOSIS — Z30431 Encounter for routine checking of intrauterine contraceptive device: Secondary | ICD-10-CM | POA: Diagnosis not present

## 2021-05-25 DIAGNOSIS — Z0000 Encounter for general adult medical examination without abnormal findings: Secondary | ICD-10-CM

## 2021-05-25 DIAGNOSIS — R569 Unspecified convulsions: Secondary | ICD-10-CM | POA: Diagnosis not present

## 2021-05-25 DIAGNOSIS — Z8052 Family history of malignant neoplasm of bladder: Secondary | ICD-10-CM | POA: Diagnosis not present

## 2021-05-25 DIAGNOSIS — G40909 Epilepsy, unspecified, not intractable, without status epilepticus: Secondary | ICD-10-CM

## 2021-05-25 DIAGNOSIS — Z111 Encounter for screening for respiratory tuberculosis: Secondary | ICD-10-CM | POA: Diagnosis not present

## 2021-05-25 DIAGNOSIS — Z01419 Encounter for gynecological examination (general) (routine) without abnormal findings: Secondary | ICD-10-CM | POA: Diagnosis not present

## 2021-05-25 DIAGNOSIS — C719 Malignant neoplasm of brain, unspecified: Secondary | ICD-10-CM | POA: Diagnosis not present

## 2021-05-25 DIAGNOSIS — E663 Overweight: Secondary | ICD-10-CM | POA: Diagnosis not present

## 2021-05-25 LAB — URINALYSIS, ROUTINE W REFLEX MICROSCOPIC
Bilirubin Urine: NEGATIVE
Ketones, ur: NEGATIVE
Leukocytes,Ua: NEGATIVE
Nitrite: NEGATIVE
Specific Gravity, Urine: 1.015 (ref 1.000–1.030)
Total Protein, Urine: NEGATIVE
Urine Glucose: NEGATIVE
Urobilinogen, UA: 0.2 (ref 0.0–1.0)
pH: 7 (ref 5.0–8.0)

## 2021-05-25 LAB — COMPREHENSIVE METABOLIC PANEL
ALT: 25 U/L (ref 0–35)
AST: 19 U/L (ref 0–37)
Albumin: 4.6 g/dL (ref 3.5–5.2)
Alkaline Phosphatase: 56 U/L (ref 39–117)
BUN: 15 mg/dL (ref 6–23)
CO2: 27 mEq/L (ref 19–32)
Calcium: 9.5 mg/dL (ref 8.4–10.5)
Chloride: 103 mEq/L (ref 96–112)
Creatinine, Ser: 0.77 mg/dL (ref 0.40–1.20)
GFR: 105.48 mL/min (ref >60.00)
Glucose, Bld: 79 mg/dL (ref 70–99)
Potassium: 4.1 mEq/L (ref 3.5–5.1)
Sodium: 139 mEq/L (ref 135–145)
Total Bilirubin: 0.4 mg/dL (ref 0.2–1.2)
Total Protein: 7.2 g/dL (ref 6.0–8.3)

## 2021-05-25 LAB — LIPID PANEL
Cholesterol: 211 mg/dL — ABNORMAL HIGH (ref 0–200)
HDL: 49.2 mg/dL (ref >39.00)
LDL Cholesterol: 138 mg/dL — ABNORMAL HIGH (ref 0–99)
NonHDL: 161.94
Total CHOL/HDL Ratio: 4
Triglycerides: 121 mg/dL (ref 0.0–149.0)
VLDL: 24.2 mg/dL (ref 0.0–40.0)

## 2021-05-25 MED ORDER — LORAZEPAM 1 MG PO TABS: ORAL_TABLET | ORAL | 1 refills | Status: DC

## 2021-05-25 MED ORDER — LEVETIRACETAM 1000 MG PO TABS: ORAL_TABLET | ORAL | Status: DC

## 2021-05-25 MED ORDER — MIRENA (52 MG) 20 MCG/DAY IU IUD: 1.0000 | INTRAUTERINE_SYSTEM | Freq: Once | INTRAUTERINE | 0 refills | Status: AC

## 2021-05-25 NOTE — Patient Instructions (Addendum)
Please complete lab work prior to leaving.   Preventive Care 73-28 Years Old, Female Preventive care refers to lifestyle choices and visits with your health care provider that can promote health and wellness. This includes: A yearly physical exam. This is also called an annual wellness visit. Regular dental and eye exams. Immunizations. Screening for certain conditions. Healthy lifestyle choices, such as: Eating a healthy diet. Getting regular exercise. Not using drugs or products that contain nicotine and tobacco. Limiting alcohol use. What can I expect for my preventive care visit? Physical exam Your health care provider may check your: Height and weight. These may be used to calculate your BMI (body mass index). BMI is a measurement that tells if you are at a healthy weight. Heart rate and blood pressure. Body temperature. Skin for abnormal spots. Counseling Your health care provider may ask you questions about your: Past medical problems. Family's medical history. Alcohol, tobacco, and drug use. Emotional well-being. Home life and relationship well-being. Sexual activity. Diet, exercise, and sleep habits. Work and work Statistician. Access to firearms. Method of birth control. Menstrual cycle. Pregnancy history. What immunizations do I need?  Vaccines are usually given at various ages, according to a schedule. Your health care provider will recommend vaccines for you based on your age, medicalhistory, and lifestyle or other factors, such as travel or where you work. What tests do I need?  Blood tests Lipid and cholesterol levels. These may be checked every 5 years starting at age 72. Hepatitis C test. Hepatitis B test. Screening Diabetes screening. This is done by checking your blood sugar (glucose) after you have not eaten for a while (fasting). STD (sexually transmitted disease) testing, if you are at risk. BRCA-related cancer screening. This may be done if you have  a family history of breast, ovarian, tubal, or peritoneal cancers. Pelvic exam and Pap test. This may be done every 3 years starting at age 24. Starting at age 13, this may be done every 5 years if you have a Pap test in combination with an HPV test. Talk with your health care provider about your test results, treatment options,and if necessary, the need for more tests. Follow these instructions at home: Eating and drinking  Eat a healthy diet that includes fresh fruits and vegetables, whole grains, lean protein, and low-fat dairy products. Take vitamin and mineral supplements as recommended by your health care provider. Do not drink alcohol if: Your health care provider tells you not to drink. You are pregnant, may be pregnant, or are planning to become pregnant. If you drink alcohol: Limit how much you have to 0-1 drink a day. Be aware of how much alcohol is in your drink. In the [place] one drink equals one 12 oz bottle of beer (355 mL), one 5 oz glass of wine (148 mL), or one 1 oz glass of hard liquor (44 mL).  Lifestyle Take daily care of your teeth and gums. Brush your teeth every morning and night with fluoride toothpaste. Floss one time each day. Stay active. Exercise for at least 30 minutes 5 or more days each week. Do not use any products that contain nicotine or tobacco, such as cigarettes, e-cigarettes, and chewing tobacco. If you need help quitting, ask your health care provider. Do not use drugs. If you are sexually active, practice safe sex. Use a condom or other form of protection to prevent STIs (sexually transmitted infections). If you do not wish to become pregnant, use a form of birth control. If  you plan to become pregnant, see your health care provider for a prepregnancy visit. Find healthy ways to cope with stress, such as: Meditation, yoga, or listening to music. Journaling. Talking to a trusted person. Spending time with friends and family. Safety Always wear your  seat belt while driving or riding in a vehicle. Do not drive: If you have been drinking alcohol. Do not ride with someone who has been drinking. When you are tired or distracted. While texting. Wear a helmet and other protective equipment during sports activities. If you have firearms in your house, make sure you follow all gun safety procedures. Seek help if you have been physically or sexually abused. What's next? Go to your health care provider once a year for an annual wellness visit. Ask your health care provider how often you should have your eyes and teeth checked. Stay up to date on all vaccines. This information is not intended to replace advice given to you by your health care provider. Make sure you discuss any questions you have with your healthcare provider. Document Revised: 06/07/2020 Document Reviewed: 06/21/2018 Elsevier Patient Education  2022 [employer].

## 2021-05-25 NOTE — Assessment & Plan Note (Signed)
Status post resection 2019- management per oncology at [facility].

## 2021-05-25 NOTE — Progress Notes (Signed)
   Stacy Gutierrez Oct 01, 1991 [id number]   History:  30 y.o. G0 presents to establish care. Mirena IUD inserted 06/2017. Normal pap history. Believes she has recurrent yeast infections. They were occurring most months prior to menses, but she has not have a cycle for 2-3 months and has not experienced vaginal itching since menses stopped. Has received Gardasil series. History of brain tumor.   Gynecologic History No LMP recorded. (Menstrual status: IUD).   Contraception/Family planning: IUD Sexually active: Yes  Health Maintenance Last Pap: 03/2017. Results were: Normal Last mammogram: Not indicated Last colonoscopy: Not indicated Last Dexa: Not indicated   Past medical history, past surgical history, family history and social history were all reviewed and documented in the EPIC chart. Married. 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 Has Master's.   ROS:  A ROS was performed and pertinent positives and negatives are included.  Exam:  Vitals:   05/25/21 1423  BP: 110/68  Pulse: 78  Resp: 18  Weight: 192 lb 6.4 oz (87.3 kg)  Height: 5' 4.86" (1.647 m)   Body mass index is 32.15 kg/m.  General appearance:  Normal Thyroid:  Symmetrical, normal in size, without palpable masses or nodularity. Respiratory  Auscultation:  Clear without wheezing or rhonchi Cardiovascular  Auscultation:  Regular rate, without rubs, murmurs or gallops  Edema/varicosities:  Not grossly evident Abdominal  Soft,nontender, without masses, guarding or rebound.  Liver/spleen:  No organomegaly noted  Hernia:  None appreciated  Skin  Inspection:  Grossly normal Breasts: Examined lying and sitting.   Right: Without masses, retractions, nipple discharge or axillary adenopathy.   Left: Without masses, retractions, nipple discharge or axillary adenopathy. Genitourinary   Inguinal/mons:  Normal without inguinal adenopathy  External genitalia:  Normal appearing vulva with no masses, tenderness, or  lesions  BUS/Urethra/Skene's glands:  Normal  Vagina:  Normal appearing with normal color and discharge, no lesions  Cervix:  Normal appearing without discharge or lesions. IUD string visible about 2 cm  Uterus:  Normal in size, shape and contour.  Midline and mobile, nontender  Adnexa/parametria:     Rt: Normal in size, without masses or tenderness.   Lt: Normal in size, without masses or tenderness.  Anus and perineum: Normal  Patient informed chaperone available to be present for breast and pelvic exam. Patient has requested no chaperone to be present. Patient has been advised what will be completed during breast and pelvic exam.   Assessment/Plan:  30 y.o. G0 for annual exam.   Well female exam with routine gynecological exam - Plan: Cytology - PAP( [place]). Education provided on SBEs, importance of preventative screenings, current guidelines, high calcium diet, regular exercise, and multivitamin daily. Labs with PCP.   Encounter for routine checking of intrauterine contraceptive device (IUD) - Mirena IUD inserted 06/2017. Was having light monthly cycles until a few months back and now she has occasional spotting. She is aware of FDA approval for 7 years for pregnancy prevention.   Intermittent vaginal itching - recommend office visit when symptoms are occurring to evaluate for yeast.   Screening for cervical cancer - Normal Pap history.  Pap today.   Return in 1 year for annual.   Tamela Gammon DNP, 2:39 PM 05/25/2021

## 2021-05-25 NOTE — Assessment & Plan Note (Signed)
Encouraged pt to continue healthy diet and exercise.  She will get Pap with GYN later today. Labs as ordered.  Immunizations reviewed and up to date.

## 2021-05-25 NOTE — Progress Notes (Signed)
Subjective:     Patient ID: Stacy Gutierrez, female    DOB: 21-Dec-1990, 30 y.o.   MRN: [id number]  Chief Complaint  Patient presents with   Annual Exam    HPI Patient is in today to re-establish care. I last saw her in 2013.  Since that time she diagnosed with a left frontal temporal astrocytoma which was diagnosed in 2019.   This was initially diagnosed following new onset seizure.   This was treated with a left frontotemporal craniotomy. She is being monitored with surveillance imaging via MR in Q6 months. She is maintained on seizure prophylaxis with Keflex.  (Records from [facility] reviewed in Care Everywhere).   Obesity-  Wt Readings from Last 3 Encounters:  05/25/21 192 lb (87.1 kg)  02/03/20 178 lb (80.7 kg)  01/31/20 175 lb 9.6 oz (79.7 kg)   Immunizations: she has had pfizer x3.  Tdap 2021 Diet: healthy Exercise:enjoys rowing, daily you tube videos Pap Smear: has appointment today  Vision: up to date Dental:  up to datecr  Health Maintenance Due  Topic Date Due   HIV Screening  Never done   Hepatitis C Screening  Never done   PAP-Cervical Cytology Screening  Never done   PAP SMEAR-Modifier  Never done   INFLUENZA VACCINE  05/24/2021    Past Medical History:  Diagnosis Date   Astrocytoma (HCC)    Mononucleosis 01/22/2010   Seizures (HCC)    Vasovagal syncope     Past Surgical History:  Procedure Laterality Date   CRANIOTOMY Left    TIBIA FRACTURE SURGERY  03/24/2010   Tibial Fracture-left leg--plate/screw--Dr. Samuel Bouche   TONSILLECTOMY      Family History  Problem Relation Age of Onset   Bladder Cancer Mother    Diabetes type II Maternal Grandmother        "heart issues"   Peripheral vascular disease Maternal Grandmother    Coronary artery disease Other    Diabetes Other    Hyperlipidemia Other    Stroke Other     Social History   Socioeconomic History   Marital status: Single    Spouse name: Not on file   Number of children: 0   Years of education:  Not on file   Highest education level: Not on file  Occupational History   Occupation: Student  Tobacco Use   Smoking status: Never   Smokeless tobacco: Never  Substance and Sexual Activity   Alcohol use: No   Drug use: Not on file   Sexual activity: Yes    Partners: Male    Birth control/protection: I.U.D.  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     atovaquone-proguanil (MALARONE) 250-100 MG TABS tablet Take 1 tablet by mouth once daily beginning 2 days before travel and continuing for 7 days after return (Patient not taking: Reported on 01/31/2020) 21 tablet 0   levETIRAcetam (KEPPRA) 1000 MG tablet Take by mouth.  meloxicam (MOBIC) 7.5 MG tablet Take 1 tablet (7.5 mg total) by mouth daily. 14 tablet 0   typhoid (VIVOTIF) DR capsule One capsule on alternate days (day 1, 3, 5, and 7) for a total of 4 doses; all doses should be complete at least 1 week prior to potential exposure (Patient not taking: Reported on 01/31/2020) 4 capsule 0   No facility-administered medications prior to visit.    No Known Allergies  Review of Systems  Constitutional:  Negative for weight loss.  HENT:  Negative for congestion and hearing loss.   Eyes:  Negative for blurred vision.  Respiratory:  Negative for cough.   Cardiovascular:  Negative for chest pain.  Gastrointestinal:  Negative for nausea and vomiting.  Genitourinary:  Negative for dysuria, frequency and hematuria.   Musculoskeletal:  Negative for joint pain and myalgias.  Neurological:  Negative for headaches.  Psychiatric/Behavioral:         Denies depression/anxiety      Objective:    Physical Exam  BP 123/71 (BP Location: Right Arm, Patient Position: Sitting, Cuff Size: Small)   Pulse 68   Temp 98.7 F (37.1 C) (Oral)   Resp 16   Ht $R'5\' 5"'Zs$  (1.651 m)   Wt 192 lb (87.1 kg)   SpO2 98%   BMI 31.95 kg/m  Wt Readings from Last 3 Encounters:  05/25/21 192 lb (87.1 kg)  02/03/20 178 lb (80.7 kg)  01/31/20 175 lb 9.6 oz (79.7 kg)   Physical Exam  Constitutional: She is oriented to person, place, and time. She appears well-developed and well-nourished. No distress.  HENT:  Head: Normocephalic and atraumatic.  Right Ear: Tympanic membrane and ear canal normal.  Left Ear: Tympanic membrane and ear canal normal.  Mouth/Throat: Not examined- pt wearing mask Eyes: Pupils are equal, round, and reactive to light. No scleral icterus.  Neck: Normal range of motion. No thyromegaly present.  Cardiovascular: Normal rate and regular rhythm.   No murmur heard. Pulmonary/Chest: Effort normal and breath sounds normal. No respiratory distress. He has no wheezes. She has no rales. She exhibits no tenderness.  Abdominal: Soft. Bowel sounds are normal. She exhibits no distension and no mass. There is no tenderness. There is no rebound and no guarding.  Musculoskeletal: She exhibits no edema.  Lymphadenopathy:    She has no cervical adenopathy.  Neurological: She is alert and oriented to person, place, and time. She has normal patellar reflexes. She exhibits normal muscle tone. Coordination normal.  Skin: Skin is warm and dry.  Psychiatric: She has a normal mood and affect. Her behavior is normal. Judgment and thought content normal.  Breasts: Examined lying Right: Without masses, retractions, discharge or axillary adenopathy.  Left: Without masses, retractions, discharge or axillary adenopathy.  Pelvic:  deferred to GYN          Assessment & Plan:       Assessment & Plan:   Problem List Items Addressed This Visit       Unprioritized   Seizures ([place])    She has had a total of 2 seizures. One before diagnosis of astrocytoma and one after when she missed 3 doses of her Keppra.  She is now more compliant with Keppra and has had no further seizures. Management per Neurology (Dr. Laurena Slimmer) at [facility].        Relevant Medications   lamoTRIgine (LAMICTAL) 25 MG tablet   levETIRAcetam (KEPPRA) 1000 MG tablet   General medical examination    Encouraged pt  to continue healthy diet and exercise.  She will get Pap with GYN later today. Labs as ordered.  Immunizations reviewed and up to date.        Astrocytoma [facility])    Status post resection 2019- management per oncology at [facility].        Relevant Medications   LORazepam (ATIVAN) 1 MG tablet   Other Visit Diagnoses     Preventative health care    -  Primary   Relevant Orders   Comp Met (CMET)   Urinalysis, Routine w reflex microscopic   Lipid panel   Overweight       Relevant Orders   Lipid panel   Family history of bladder cancer       Relevant Orders   Urinalysis, Routine w reflex microscopic   Seizure disorder (HCC)       Relevant Medications   lamoTRIgine (LAMICTAL) 25 MG tablet   levETIRAcetam (KEPPRA) 1000 MG tablet   Other Relevant Orders   Comp Met (CMET)   Tuberculosis screening       Relevant Orders   QuantiFERON-TB Gold Plus       I have discontinued Irwin Brakeman. King's typhoid, atovaquone-proguanil, levETIRAcetam, and meloxicam. I am also having her start on Mirena (52 MG), levETIRAcetam, and LORazepam. Additionally, I am having her maintain her lamoTRIgine.  Meds ordered this encounter  Medications   levonorgestrel (MIRENA, 52 MG,) 20 MCG/DAY IUD    Sig: 1 each by Intrauterine route once for 1 dose.    Dispense:  1 each    Refill:  0    Order Specific Question:   Supervising Provider    Answer:    Penni Homans A [[id number]]   levETIRAcetam (KEPPRA) 1000 MG tablet    Sig: 1575m by mouth twice daily    Order Specific Question:   Supervising Provider    Answer:   BPenni HomansA [[id number]]   LORazepam (ATIVAN) 1 MG tablet    Sig: one tablet (1 mg total) under tongue at onset of seizure. May repeat every 10 minutes for up to 3 doses if needed    Dispense:  20 tablet    Refill:  1    Order Specific Question:   Supervising Provider    Answer:   BPenni HomansA [[id number]]

## 2021-05-25 NOTE — Assessment & Plan Note (Signed)
She has had a total of 2 seizures. One before diagnosis of astrocytoma and one after when she missed 3 doses of her Keppra.  She is now more compliant with Keppra and has had no further seizures. Management per Neurology (Dr. Laurena Slimmer) at [facility].

## 2021-05-26 LAB — CYTOLOGY - PAP: Diagnosis: NEGATIVE

## 2021-05-27 ENCOUNTER — Encounter: Payer: Self-pay | Admitting: Family

## 2021-05-27 LAB — QUANTIFERON-TB GOLD PLUS
Mitogen-NIL: >10 IU/mL
NIL: 0.02 IU/mL
QuantiFERON-TB Gold Plus: NEGATIVE
TB1-NIL: 0 IU/mL
TB2-NIL: 0 IU/mL

## 2021-06-12 DIAGNOSIS — R471 Dysarthria and anarthria: Secondary | ICD-10-CM | POA: Diagnosis not present

## 2021-06-12 DIAGNOSIS — D496 Neoplasm of unspecified behavior of brain: Secondary | ICD-10-CM | POA: Diagnosis not present

## 2021-06-12 DIAGNOSIS — D72829 Elevated white blood cell count, unspecified: Secondary | ICD-10-CM | POA: Diagnosis not present

## 2021-06-12 DIAGNOSIS — R0902 Hypoxemia: Secondary | ICD-10-CM | POA: Diagnosis not present

## 2021-06-12 DIAGNOSIS — Z3202 Encounter for pregnancy test, result negative: Secondary | ICD-10-CM | POA: Diagnosis not present

## 2021-06-12 DIAGNOSIS — C719 Malignant neoplasm of brain, unspecified: Secondary | ICD-10-CM | POA: Diagnosis not present

## 2021-06-12 DIAGNOSIS — R110 Nausea: Secondary | ICD-10-CM | POA: Diagnosis not present

## 2021-06-12 DIAGNOSIS — R519 Headache, unspecified: Secondary | ICD-10-CM | POA: Diagnosis not present

## 2021-06-12 DIAGNOSIS — G936 Cerebral edema: Secondary | ICD-10-CM | POA: Diagnosis not present

## 2021-06-12 DIAGNOSIS — R3121 Asymptomatic microscopic hematuria: Secondary | ICD-10-CM | POA: Diagnosis not present

## 2021-06-12 DIAGNOSIS — R569 Unspecified convulsions: Secondary | ICD-10-CM | POA: Diagnosis not present

## 2021-06-13 DIAGNOSIS — C719 Malignant neoplasm of brain, unspecified: Secondary | ICD-10-CM | POA: Diagnosis not present

## 2021-06-13 DIAGNOSIS — G4089 Other seizures: Secondary | ICD-10-CM | POA: Diagnosis not present

## 2021-06-13 DIAGNOSIS — R569 Unspecified convulsions: Secondary | ICD-10-CM | POA: Diagnosis not present

## 2021-06-13 DIAGNOSIS — Z20822 Contact with and (suspected) exposure to covid-19: Secondary | ICD-10-CM | POA: Diagnosis not present

## 2021-06-13 DIAGNOSIS — C718 Malignant neoplasm of overlapping sites of brain: Secondary | ICD-10-CM | POA: Diagnosis not present

## 2021-06-13 DIAGNOSIS — K219 Gastro-esophageal reflux disease without esophagitis: Secondary | ICD-10-CM | POA: Diagnosis not present

## 2021-06-13 DIAGNOSIS — Z79899 Other long term (current) drug therapy: Secondary | ICD-10-CM | POA: Diagnosis not present

## 2021-06-17 DIAGNOSIS — D72829 Elevated white blood cell count, unspecified: Secondary | ICD-10-CM | POA: Diagnosis not present

## 2021-06-17 DIAGNOSIS — C719 Malignant neoplasm of brain, unspecified: Secondary | ICD-10-CM | POA: Diagnosis not present

## 2021-06-21 ENCOUNTER — Encounter: Payer: BC Managed Care – PPO | Admitting: Family

## 2021-06-21 ENCOUNTER — Telehealth: Payer: Self-pay | Admitting: Internal Medicine

## 2021-06-21 NOTE — Telephone Encounter (Signed)
Scheduled appt per 8/29 referral. Pt aware of appt date and time.

## 2021-06-22 ENCOUNTER — Inpatient Hospital Stay: Payer: BC Managed Care – PPO | Attending: Internal Medicine | Admitting: Internal Medicine

## 2021-06-22 ENCOUNTER — Other Ambulatory Visit: Payer: Self-pay

## 2021-06-22 VITALS — BP 128/84 | HR 89 | Temp 97.9°F | Resp 18 | Wt 192.1 lb

## 2021-06-22 DIAGNOSIS — C712 Malignant neoplasm of temporal lobe: Secondary | ICD-10-CM

## 2021-06-22 DIAGNOSIS — C719 Malignant neoplasm of brain, unspecified: Secondary | ICD-10-CM

## 2021-06-22 DIAGNOSIS — Z79899 Other long term (current) drug therapy: Secondary | ICD-10-CM | POA: Diagnosis not present

## 2021-06-22 NOTE — Progress Notes (Signed)
[facility] at [address] [place], [place] [address] [phone]   New Patient Evaluation  Date of Service: 06/22/21 Patient Name: Stacy Gutierrez Patient MRN: [id number] Patient DOB: 1991/01/20 Provider: Ventura Sellers, MD  Identifying Statement:  Stacy Gutierrez is a 30 y.o. 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    Referring Provider: Debbrah Alar, NP [address] [place],  [place] [address]  Oncologic History: Oncology History  Astrocytoma ([place])  04/05/2018 Surgery   Craniotomy, resection with Dr. Tommi Rumps at [facility].  Path demonstrates WHO 2 diffuse astrocytoma, IDH mt   06/12/2021 Progression   Progression of disease #1    Biomarkers:  MGMT Unknown.  IDH 1/2 Mutated.  EGFR Unknown  TERT Unmutated   History of Present Illness: The patient's records from the referring physician were obtained and reviewed and the patient interviewed to confirm this HPI.  Stacy Gutierrez presents today as referral from Dr. Ferdinand Lango at [facility], for local radiation and chemotherapy for progressive glioma.  Patient describes breakthrough seizure episode earlier this month, leading [place] [facility].  Vimpat was poorly tolerated, so Lamictal titration was initiated.  She is currently up to 17m BID, with plans to increase to 1046mBID. Also on Keppra 1500/2000.  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  [facility] team is recommending radiation and Temodar given recent progression noted on MRI.  No exposure to RT or chemo since diagnosis in 2019.  Medications: Current Outpatient Medications on File Prior to Visit  Medication Sig Dispense Refill   lamoTRIgine (LAMICTAL) 25 MG tablet Take 25 mg by mouth daily.     levETIRAcetam (KEPPRA) 1000 MG  tablet 150029my mouth twice daily (Patient taking differently: 1500m63m morning and 2000 at night)     levonorgestrel (MIRENA, 52 MG,) 20 MCG/DAY IUD 1 each by Intrauterine route once for 1 dose. 1 each 0   No current facility-administered medications on file prior to visit.    Allergies: No Known Allergies Past Medical History:  Past Medical History:  Diagnosis Date   Astrocytoma (HCC)[place] Mononucleosis 01/22/2010   Seizures (HCC)[place] Vasovagal syncope    Past Surgical History:  Past Surgical History:  Procedure Laterality Date   CRANIOTOMY Left    TIBIA FRACTURE SURGERY  03/24/2010   Tibial Fracture-left leg--plate/screw--Dr. 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  Strain: Not on file  Food Insecurity: Not on file  Transportation Needs: Not on file  Physical Activity: Not on file  Stress: Not on file  Social Connections: Not on file  Intimate Partner Violence: Not on file   Family History:  Family History  Problem Relation Age of Onset   Bladder Cancer Mother    Diabetes type II Maternal Grandmother        "heart issues"   Peripheral vascular disease Maternal Grandmother    Coronary artery disease Other    Diabetes Other    Hyperlipidemia Other    Stroke Other      Review of Systems: Constitutional: Doesn't report fevers, chills or abnormal weight loss Eyes: Doesn't report blurriness of vision Ears, nose, mouth, throat, and face: Doesn't report sore throat Respiratory: Doesn't report cough, dyspnea or wheezes Cardiovascular: Doesn't report palpitation, chest discomfort  Gastrointestinal:  Doesn't report nausea, constipation, diarrhea GU: Doesn't report incontinence Skin: Doesn't report skin rashes Neurological: Per HPI Musculoskeletal: Doesn't report joint pain Behavioral/Psych: Doesn't report anxiety  Physical Exam: Vitals:   06/22/21 0952  BP: 128/84  Pulse: 89  Resp: 18  Temp: 97.9 F (36.6 C)  SpO2: 98%   KPS: 90. General: Alert, cooperative, pleasant, in no acute distress Head: Normal EENT: No conjunctival injection or scleral icterus.  Lungs: Resp effort normal Cardiac: Regular rate Abdomen: Non-distended abdomen Skin: No rashes cyanosis or petechiae. Extremities: No clubbing or edema  Neurologic Exam: Mental Status: Awake, alert, attentive to examiner. Oriented to self and environment. Language is fluent with intact comprehension.  Cranial Nerves: Visual acuity is grossly normal. Visual fields are full. Extra-ocular movements intact. No ptosis. Face is symmetric Motor: Tone and bulk are normal. Power is full in both arms and legs. Reflexes are symmetric, no pathologic reflexes present.  Sensory: Intact to light touch Gait: Normal.   Labs: I have reviewed the data as listed    Component Value Date/Time   NA 139 05/25/2021 0935   K 4.1 05/25/2021 0935   CL 103 05/25/2021 0935   CO2 27 05/25/2021 0935   GLUCOSE 79 05/25/2021 0935   BUN 15 05/25/2021 0935   CREATININE 0.77 05/25/2021 0935   CALCIUM 9.5 05/25/2021 0935   PROT 7.2 05/25/2021 0935   ALBUMIN 4.6 05/25/2021 0935   AST 19 05/25/2021 0935   ALT 25 05/25/2021 0935   ALKPHOS 56 05/25/2021 0935   BILITOT 0.4 05/25/2021 0935   Lab Results  Component  Value Date   WBC 12.9 02/11/2010   NEUTROABS 9.0 (H) 02/11/2010   HGB 14.1 02/11/2010   HCT 42.5 02/11/2010   MCV 87.6 02/11/2010   PLT 310 02/11/2010    Imaging: 06/12/21   Pathology: Surgical Pathology Case: [id number]  Authorizing Provider: Baruch Goldmann, MD Collected: 04/04/2018 1301  Ordering Location: [facility] Received: 04/05/2018 [place]  Periop  Pathologist: Jeris Penta, MD  Intraop: Herma Ard, MD  Specimens: A) - Brain, Brain tumor  B) - Brain, Brain tissue permanent   DIAGNOSIS  A, B. Brain tumor, left frontotemporal, resection:  Diffuse astrocytoma, IDH-mutant, WHO grade II.  Pathologists are physicians who are part of your health care team, and who specialize in diagnosing disease by examining your biopsy cells and tissue under a microscope. If you would like to see your biopsy pathology under the microscope please send me a message in [facility] and I will arrange this with you.  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 ATRX shows loss.   Assessment/Plan Astrocytoma [facility])  We appreciate the opportunity to participate in the care of Stacy Gutierrez.  She is clinically improved after breakthrough seizures two weeks prior.   We had an extensive conversation with her regarding role of radiation and chemotherapy for progressive low grade gliomas.    Per Dr. Ferdinand Lango' team, we ultimately recommended proceeding with course of intensity modulated radiation therapy and concurrent daily Temozolomide.  Radiation will be administered Mon-Fri over 6 weeks, Temodar will be dosed at 73m/m2 to be given daily over 42 days.  We reviewed side effects of temodar, including fatigue, nausea/vomiting, constipation, and cytopenias.  Informed consent was verbally obtained at bedside to proceed with oral chemotherapy.  Initiation of chemotherapy and radiation will be deferred until evaluation by hematology  for leukocytosis of unknown origin.  We will also wait on evaluation with reproductive endocrinology for discussion of family planning in setting of cancer therapies.    Chemotherapy should be held for the following:  ANC less than 1,000  Platelets less than 100,000  LFT or creatinine greater than 2x ULN  If clinical concerns/contraindications develop  Every 2 weeks during radiation, labs will be checked accompanied by a clinical evaluation in the brain tumor clinic.  For seizures, she will continue on Keppra and Lamictal titration as ordered.  Screening for potential clinical trials was performed and discussed using eligibility criteria for [facility] centers, as well as national database available on [email or web address]    The patient is not a candidate for a research protocol at this time due to no suitable study identified.   We spent twenty additional minutes teaching regarding the natural history, biology, and historical experience in the treatment of brain tumors. We then discussed in detail the current recommendations for therapy focusing on the mode of administration, mechanism of action, anticipated toxicities, and quality of life issues associated with this plan. We also provided teaching sheets for the patient to take home as an additional resource.  All questions were answered. The patient knows to call the clinic with any problems, questions or concerns. No barriers to learning were detected.  The total time spent in the encounter was 60 minutes and more than 50% was on counseling and review of test results   ZVentura Sellers MD Medical Director of Neuro-Oncology [facility] [place] [place]08/30/22 9:55 AM

## 2021-06-23 ENCOUNTER — Encounter: Payer: Self-pay | Admitting: Internal Medicine

## 2021-06-23 DIAGNOSIS — R569 Unspecified convulsions: Secondary | ICD-10-CM | POA: Diagnosis not present

## 2021-06-23 DIAGNOSIS — D72829 Elevated white blood cell count, unspecified: Secondary | ICD-10-CM | POA: Diagnosis not present

## 2021-06-23 DIAGNOSIS — C719 Malignant neoplasm of brain, unspecified: Secondary | ICD-10-CM | POA: Diagnosis not present

## 2021-06-23 DIAGNOSIS — D72821 Monocytosis (symptomatic): Secondary | ICD-10-CM | POA: Diagnosis not present

## 2021-06-25 DIAGNOSIS — Z3162 Encounter for fertility preservation counseling: Secondary | ICD-10-CM | POA: Diagnosis not present

## 2021-06-29 ENCOUNTER — Other Ambulatory Visit: Payer: Self-pay | Admitting: Radiation Therapy

## 2021-06-29 ENCOUNTER — Other Ambulatory Visit: Payer: Self-pay | Admitting: Radiation Oncology

## 2021-06-29 ENCOUNTER — Ambulatory Visit
Admission: RE | Admit: 2021-06-29 | Discharge: 2021-06-29 | Disposition: A | Discharge status: Home / Self Care | Payer: Self-pay | Source: Ambulatory Visit | Attending: Radiation Oncology | Admitting: Radiation Oncology

## 2021-06-29 DIAGNOSIS — Z3162 Encounter for fertility preservation counseling: Secondary | ICD-10-CM | POA: Diagnosis not present

## 2021-06-29 DIAGNOSIS — C719 Malignant neoplasm of brain, unspecified: Secondary | ICD-10-CM

## 2021-06-29 DIAGNOSIS — N911 Secondary amenorrhea: Secondary | ICD-10-CM | POA: Diagnosis not present

## 2021-06-29 DIAGNOSIS — Z79899 Other long term (current) drug therapy: Secondary | ICD-10-CM | POA: Diagnosis not present

## 2021-06-29 NOTE — Progress Notes (Signed)
a 

## 2021-07-01 NOTE — Progress Notes (Addendum)
Radiation Oncology         [phone] ________________________________  Initial Outpatient Consultation  Name: Stacy Gutierrez MRN: [id number]  Date: 07/02/2021  DOB: [birth year]  CC:O'Sullivan, Lenna Sciara, NP  Vaslow, Acey Lav, MD   REFERRING PHYSICIAN: Ventura Sellers, MD  DIAGNOSIS:    ICD-10-CM   1. Malignant neoplasm of frontal lobe (HCC)  C71.1       Recurrence of diffuse astrocytoma, IDH-mutant; WHO grade II.  Cancer Staging Astrocytoma [facility]) Staging form: Brain and Spinal Cord, AJCC 8th Edition - Pathologic: WHO Grade II - Signed by Ventura Sellers, MD on 06/22/2021 Stage prefix: Initial diagnosis Histologic grading system: 4 grade system Extent of surgical resection: Subtotal resection Solitary (s) or multifocal (m) tumors in the primary site: Solitary Tumor location in brain: Eloquent brain area Karnofsky performance status: Score 90 Seizures at presentation: Present IDH1 mutation: Positive   CHIEF COMPLAINT: Here to discuss management of brain cancer  HISTORY OF PRESENT ILLNESS::Stacy Gutierrez is a 30 y.o. female who presents with recurrence of brain tumor. The patient initially presented to Dr. Tommi Rumps [facility]) following a seizure episode in June of 2019 and was found to have a brain lesion on imaging. Subsequently, the patient underwent craniotomy for resectioning of the lesion on 04/04/18 revealing diffuse astrocytoma, IDH-mutant; WHO grade II.   In recent history, the patient presented to the ED on 06/12/21 with nausea, vomiting, seizure like activity beginning that morning. 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 At the time the patient was taking 1566m Keppra BID and Lamictal 225mdaily, and reported never missing a dosage of her seizure medication. EEG administered showed frequent left frontotemporal discharges. Brain MRI performed at this  time demonstrated concern for worsening tumor disease, and noted as likely the cause for her seizures.   Brain MRI performed on 06/15/21 demonstrated a subtle increase in the mass-like T2 hyperintensity surrounding the left frontal temporal resection cavity when compared to the prior exam dated 03/01/2021. No new areas of enhancement we seen.   Subsequently, the patient met with Dr. PeFerdinand Langon 06/17/21 and expressed interest in proceeding with radiation therapy and temozolomide. Dr. PeFerdinand Langohen referred the patient to Dr. VaMickeal Skinnernd Dr. StQuinn Axeor neuro-oncology management and seizure management.   Shortly after, the patient consulted with Dr. VaMickeal Skinnern 06/22/21 who recommended proceeding with a course of intensity modulated radiation therapy and concurrent daily Temozolomide. Initiation of concurrent chemo and radiation was deferred until evaluation by hematology for leukocytosis of unknown origin. In regards to her recurrent seizures, the patient was instructed to continue on Keppra and Lamictal titration as ordered.   In regards to her leukocytosis, the patient met with Dr. PeFerdinand Langon 06/23/21. Per the visit note, Dr. PeFerdinand Langotated that differential diagnoses for her leukocytosis could include infection, inflammation, stress, smoking, trauma, endocrine problems , medications, anti-tumor response, and lastly blood disease as leukemia.  The patient reports that at her most recent visit blood lab work had resolved.  She plans on undergoing egg harvesting next week and then in vitro fertilization given uncertainty regarding how her fertility may be affected by temozolomide in the future ; the patient recently got married and would like to have children in the future..  She believes she will know how many viable embryos have developed by September 19.  She would like to start radiation as soon  as possible.  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  She is on medical leave now and interested in teaching again next semester if possible.  She is with her mother today who is very supportive.  The patient denies any focal neurologic deficits.  She does have headaches at times.   Cognitively and physically she feels like she is doing quite well with minimal complaints.   PREVIOUS RADIATION THERAPY: No  PAST MEDICAL HISTORY:  has a past medical history of Astrocytoma ([place]), Mononucleosis (01/22/2010), Seizures ([place]), and Vasovagal syncope.    PAST SURGICAL HISTORY: Past Surgical History:  Procedure Laterality Date   CRANIOTOMY Left    TIBIA FRACTURE SURGERY  03/24/2010   Tibial Fracture-left leg--plate/screw--Dr. Linton Rump   TONSILLECTOMY      FAMILY HISTORY: family history includes Bladder Cancer in her mother; Coronary artery disease in an other family member; Diabetes in an other family member; Diabetes type II in her maternal grandmother; Hyperlipidemia in an other family member; Peripheral vascular disease in her maternal grandmother; Stroke in an other family member.  SOCIAL HISTORY:  reports that she has never smoked. She has never used smokeless tobacco. She reports that she does not drink alcohol.  ALLERGIES: Patient has no known allergies.  MEDICATIONS:  Current Outpatient Medications  Medication Sig Dispense Refill   lamoTRIgine (LAMICTAL) 25 MG tablet Take 25 mg by mouth daily.     levETIRAcetam (KEPPRA) 1000 MG tablet Take 1 tablet by mouth See admin instructions. 1500 mg by mouth every morning and 2000 mg at bedtime     LORazepam (ATIVAN) 1 MG tablet Take 1 mg by mouth daily as needed.     levETIRAcetam (KEPPRA) 1000 MG tablet 157m by mouth twice daily (Patient not taking: Reported on 06/22/2021)     levonorgestrel (MIRENA, 52 MG,) 20 MCG/DAY IUD 1 each by Intrauterine route once for 1 dose. 1 each 0   No current facility-administered medications for this encounter.    REVIEW OF SYSTEMS:  Notable for that  above.   PHYSICAL EXAM:  height is 5' 5"  (1.651 m) and weight is 196 lb (88.9 kg). Her skin temperature is 97.8 F (36.6 C). Her blood pressure is 125/82 and her pulse is 80. Her oxygen saturation is 98%.   General: Alert and oriented, in no acute distress  HEENT: Head is normocephalic. Extraocular movements are intact. Oropharynx is clear. Heart: Regular in rate and rhythm with no murmurs, rubs, or gallops. Chest: Clear to auscultation bilaterally, with no rhonchi, wheezes, or rales. Skin: No concerning lesions. Musculoskeletal: symmetric strength and muscle tone throughout. Neurologic: Cranial nerves II through XII are grossly intact. No obvious focalities. Speech is fluent. Coordination is intact.  Sensation intact in her face and extremities. Psychiatric: Judgment and insight are intact. Affect is appropriate.   KPS = 90  100 - Normal; no complaints; no evidence of disease. 90   - Able to carry on normal activity; minor signs or symptoms of disease. 80   - Normal activity with effort; some signs or symptoms of disease. 759  - Cares for self; unable to carry on normal activity or to do active work. 60   - Requires occasional assistance, but is able to care for most of his personal needs. 50   - Requires considerable assistance and frequent medical care. 449  - Disabled; requires special care and assistance. 30   - Severely  disabled; hospital admission is indicated although death not imminent. 83   - Very sick; hospital admission necessary; active supportive treatment necessary. 10   - Moribund; fatal processes progressing rapidly. 0     - Dead  Karnofsky DA, Abelmann [place], Craver LS and [place] [phone]) The use of the nitrogen mustards in the palliative treatment of carcinoma: with particular reference to bronchogenic carcinoma Cancer 1 634-56   LABORATORY DATA:  Lab Results  Component Value Date   WBC 12.9 02/11/2010   HGB 14.1 02/11/2010   HCT 42.5 02/11/2010   MCV 87.6  02/11/2010   PLT 310 02/11/2010   CMP     Component Value Date/Time   NA 139 05/25/2021 0935   K 4.1 05/25/2021 0935   CL 103 05/25/2021 0935   CO2 27 05/25/2021 0935   GLUCOSE 79 05/25/2021 0935   BUN 15 05/25/2021 0935   CREATININE 0.77 05/25/2021 0935   CALCIUM 9.5 05/25/2021 0935   PROT 7.2 05/25/2021 0935   ALBUMIN 4.6 05/25/2021 0935   AST 19 05/25/2021 0935   ALT 25 05/25/2021 0935   ALKPHOS 56 05/25/2021 0935   BILITOT 0.4 05/25/2021 0935        RADIOGRAPHY: As above, I personally reviewed her imaging from [facility]     IMPRESSION/PLAN: Progressive Diffuse astrocytoma, IDH-mutant, WHO grade II.  Today, I talked to the patient about the findings and work-up thus far.  We discussed the patient's diagnosis of progressive low-grade glioma and general treatment for this, highlighting the role of radiotherapy in the management.  We discussed the available radiation techniques, and focused on the details of logistics and delivery.   I recommend that she receive 6 weeks of partial brain irradiation, 54Gy/30 fractions, with concurrent temozolomide  We talked about the timeline for treatment.  Given that she is undergoing egg harvesting and in vitro fertilization I recommended that we not start ChRT treatment until it is established that she has a satisfactory number of viable embryos.  We will go ahead and perform her CT simulation in 1-1/2 weeks but she understands that her treatment date may need to be pushed back if she does not have this information back from her fertility clinic at that time.  She will stay in touch with [place] regarding the feedback she gets from her fertility specialist.   She understands  the importance of continuing to use birth control while she is undergoing radiation therapy as this can be harmful to a fetus.  We discussed the risks, benefits, and side effects of radiotherapy. Side effects may include but not necessarily be limited to: Skin irritation to the  scalp, hair loss, fatigue, headache, nausea, brain injury, cognitive decline, hearing loss, pituitary hypofunction, secondary cancer; no guarantees of treatment were given.  No guarantees of treatment were made.  A consent form was signed and placed in the patient's medical record. The patient and her mother were  encouraged to ask questions that I answered to the best of my ability.  She understands that she has a serious medical issue that affects her life expectancy but we also discussed that the goal of treatment is cure.  She understands that it is reasonable to complete advanced directives, not just because she has a serious diagnosis, but because this is something that I recommend for all adults.  I really enjoyed meeting the patient and her mother and I very much look forward to participating in her care.   On date of service, in total,  I spent 70 minutes on this encounter. Patient was seen in person.   __________________________________________   Eppie Gibson, MD  This document serves as a record of services personally performed by Eppie Gibson, MD. It was created on her behalf by Roney Mans, a trained medical scribe. The creation of this record is based on the scribe's personal observations and the provider's statements to them. This document has been checked and approved by the attending provider.

## 2021-07-02 ENCOUNTER — Other Ambulatory Visit: Payer: Self-pay

## 2021-07-02 ENCOUNTER — Ambulatory Visit
Admission: RE | Admit: 2021-07-02 | Discharge: 2021-07-02 | Disposition: A | Discharge status: Home / Self Care | Payer: BC Managed Care – PPO | Source: Ambulatory Visit | Attending: Radiation Oncology | Admitting: Radiation Oncology

## 2021-07-02 ENCOUNTER — Encounter: Payer: Self-pay | Admitting: Radiation Oncology

## 2021-07-02 DIAGNOSIS — C719 Malignant neoplasm of brain, unspecified: Secondary | ICD-10-CM

## 2021-07-02 DIAGNOSIS — C711 Malignant neoplasm of frontal lobe: Secondary | ICD-10-CM | POA: Diagnosis not present

## 2021-07-02 NOTE — Progress Notes (Signed)
Patient in for consult for reoccuring astrocytoma. She is in the process fertility treatment to harvest eggs. She had previous migraine headaches. Family has a lot of questions.

## 2021-07-03 DIAGNOSIS — Z3162 Encounter for fertility preservation counseling: Secondary | ICD-10-CM | POA: Diagnosis not present

## 2021-07-05 ENCOUNTER — Inpatient Hospital Stay: Payer: BC Managed Care – PPO | Attending: Internal Medicine

## 2021-07-05 DIAGNOSIS — C712 Malignant neoplasm of temporal lobe: Secondary | ICD-10-CM | POA: Insufficient documentation

## 2021-07-05 DIAGNOSIS — Z79899 Other long term (current) drug therapy: Secondary | ICD-10-CM | POA: Insufficient documentation

## 2021-07-05 DIAGNOSIS — C719 Malignant neoplasm of brain, unspecified: Secondary | ICD-10-CM | POA: Diagnosis not present

## 2021-07-05 DIAGNOSIS — Z3162 Encounter for fertility preservation counseling: Secondary | ICD-10-CM | POA: Diagnosis not present

## 2021-07-06 DIAGNOSIS — Z3162 Encounter for fertility preservation counseling: Secondary | ICD-10-CM | POA: Diagnosis not present

## 2021-07-06 DIAGNOSIS — C719 Malignant neoplasm of brain, unspecified: Secondary | ICD-10-CM | POA: Diagnosis not present

## 2021-07-07 DIAGNOSIS — Z3162 Encounter for fertility preservation counseling: Secondary | ICD-10-CM | POA: Diagnosis not present

## 2021-07-09 DIAGNOSIS — C718 Malignant neoplasm of overlapping sites of brain: Secondary | ICD-10-CM | POA: Diagnosis not present

## 2021-07-09 DIAGNOSIS — Z3184 Encounter for fertility preservation procedure: Secondary | ICD-10-CM | POA: Diagnosis not present

## 2021-07-09 DIAGNOSIS — N979 Female infertility, unspecified: Secondary | ICD-10-CM | POA: Diagnosis not present

## 2021-07-12 ENCOUNTER — Other Ambulatory Visit: Payer: Self-pay | Admitting: Internal Medicine

## 2021-07-12 ENCOUNTER — Telehealth: Payer: Self-pay

## 2021-07-12 ENCOUNTER — Other Ambulatory Visit (HOSPITAL_COMMUNITY): Payer: Self-pay

## 2021-07-12 ENCOUNTER — Encounter: Payer: Self-pay | Admitting: Internal Medicine

## 2021-07-12 DIAGNOSIS — C719 Malignant neoplasm of brain, unspecified: Secondary | ICD-10-CM

## 2021-07-12 MED ORDER — TEMOZOLOMIDE 140 MG PO CAPS
75.0000 mg/m2/d | ORAL_CAPSULE | Freq: Every day | ORAL | 0 refills | Status: DC
  Filled 2021-07-12: qty 42, 42d supply, fill #0

## 2021-07-12 MED ORDER — ONDANSETRON HCL 8 MG PO TABS
8.0000 mg | ORAL_TABLET | Freq: Two times a day (BID) | ORAL | 1 refills | Status: DC | PRN
  Filled 2021-07-12: qty 30, 15d supply, fill #0

## 2021-07-12 NOTE — Telephone Encounter (Signed)
Oral Oncology Patient Advocate Encounter   Received notification from [facility] that prior authorization for Temodar is required.   PA submitted on CoverMyMeds Key [id number] Status is pending   Oral Oncology Clinic will continue to follow.  [name] [name] Phone [phone] Fax [phone] 07/12/2021 3:49 PM

## 2021-07-12 NOTE — Telephone Encounter (Signed)
Oral Oncology Patient Advocate Encounter  Prior Authorization for Temodar has been approved.    PA# [id number] Effective dates: 06/12/21 through 07/12/22  Patients must fill at [facility] will continue to follow.   [place] Patient [place] Phone [phone] Fax [phone] 07/12/2021 3:55 PM

## 2021-07-12 NOTE — Telephone Encounter (Signed)
Oral Oncology Pharmacist Encounter  Received new prescription for temozolomide (Temodar) for the treatment of grade II, diffuse astrocytoma in conjunction with radiation, planned duration 6 weeks.  Prescription dose and frequency assessed, no interventions needed.   Labs from 06/13/2021 assessed, no interventions needed.  Current medication list in Epic reviewed, no DDIs identified.  Evaluated chart and no patient barriers to medication adherence noted.   Patient agreement for treatment documented in MD note on 06/22/2021.  Prescription has been e-scribed to the [facility] for benefits analysis and approval.  Oral Oncology Clinic will continue to follow for insurance authorization, copayment issues, initial counseling and start date.  Drema Halon, PharmD Hematology/Oncology Clinical Pharmacist [facility] [phone]

## 2021-07-13 ENCOUNTER — Encounter: Payer: Self-pay | Admitting: Internal Medicine

## 2021-07-13 ENCOUNTER — Ambulatory Visit
Admission: RE | Admit: 2021-07-13 | Discharge: 2021-07-13 | Disposition: A | Discharge status: Home / Self Care | Payer: BC Managed Care – PPO | Source: Ambulatory Visit | Attending: Radiation Oncology | Admitting: Radiation Oncology

## 2021-07-13 ENCOUNTER — Other Ambulatory Visit (HOSPITAL_COMMUNITY): Payer: Self-pay

## 2021-07-13 ENCOUNTER — Telehealth: Payer: Self-pay

## 2021-07-13 ENCOUNTER — Other Ambulatory Visit: Payer: Self-pay

## 2021-07-13 ENCOUNTER — Other Ambulatory Visit: Payer: Self-pay | Admitting: Internal Medicine

## 2021-07-13 DIAGNOSIS — Z510 Encounter for antineoplastic radiation therapy: Secondary | ICD-10-CM | POA: Insufficient documentation

## 2021-07-13 DIAGNOSIS — C711 Malignant neoplasm of frontal lobe: Secondary | ICD-10-CM | POA: Diagnosis not present

## 2021-07-13 DIAGNOSIS — C719 Malignant neoplasm of brain, unspecified: Secondary | ICD-10-CM

## 2021-07-13 MED ORDER — LAMOTRIGINE 100 MG PO TABS
100.0000 mg | ORAL_TABLET | Freq: Every day | ORAL | 2 refills | Status: DC
Start: 2021-07-13 — End: 2022-03-07

## 2021-07-13 MED ORDER — TEMOZOLOMIDE 140 MG PO CAPS: 75.0000 mg/m2/d | ORAL_CAPSULE | Freq: Every day | ORAL | 0 refills | Status: DC

## 2021-07-19 MED ORDER — ONDANSETRON HCL 8 MG PO TABS: 8.0000 mg | ORAL_TABLET | Freq: Two times a day (BID) | ORAL | 1 refills | Status: DC | PRN

## 2021-07-19 NOTE — Telephone Encounter (Addendum)
Oral Chemotherapy Pharmacist Encounter  I spoke with patient for overview of: Temodar for the treatment of glioblastoma multiforme in conjunction with radiation, planned duration concomitant phase 42 days of therapy.  Patient will likely continue on Temodar for maintenance treatment for 6-12 cycles after completion of concomitant phase.  Counseled patient on administration, dosing, side effects, monitoring, drug-food interactions, safe handling, storage, and disposal.  Patient will take Temodar 140mg  capsules, 1 capsule (140mg ) by mouth once daily, may take at bedtime and on an empty stomach to decrease nausea and vomiting.  Patient will take Temodar concurrent with radiation for 42 days straight.  Temodar and radiation start date: 07/21/2021 Patient will start temodar the night of 07/20/2021   Patient stated she will receive medication from [facility] tomorrow, 07/20/21.   Patient will take Zofran 8mg  tablet, 1 tablet by mouth 30-60 min prior to Temodar dose to help decrease N/V once starting adjuvant therapy. Medication sent to [facility] per patient request.  Prophylactic Zofran will not be used at initiation of concurrent phase, but will be initiated if nausea develops despite Temodar administration on an empty stomach and at bedtime.   Adverse effects include but are not limited to: nausea, vomiting, anorexia, GI upset, rash, drug fever, and fatigue. Rare but serious adverse effects of pneumocystis pneumonia and secondary malignancy also discussed.  PCP prophylaxis will not be initiated at this time, but may be added based on lymphocyte count in the future.  Reviewed with patient importance of keeping a medication schedule and plan for any missed doses. No barriers to medication adherence identified.  Medication reconciliation performed and medication/allergy list updated.  Insurance authorization for Temodar has been obtained. Patient will receive medication through [facility].   Patient informed the pharmacy will reach out 5-7 days prior to needing next fill of Temodar to coordinate continued medication acquisition to prevent break in therapy.   All questions answered.  Stacy Gutierrez voiced understanding and appreciation.   Medication education handout placed in mail for patient. Patient knows to call the office with questions or concerns. Oral Chemotherapy Clinic phone number provided to patient.   Drema Halon, PharmD Hematology/Oncology Clinical Pharmacist [facility] [phone]

## 2021-07-20 DIAGNOSIS — C711 Malignant neoplasm of frontal lobe: Secondary | ICD-10-CM | POA: Diagnosis not present

## 2021-07-20 DIAGNOSIS — Z510 Encounter for antineoplastic radiation therapy: Secondary | ICD-10-CM | POA: Diagnosis not present

## 2021-07-21 ENCOUNTER — Other Ambulatory Visit: Payer: Self-pay

## 2021-07-21 ENCOUNTER — Ambulatory Visit
Admission: RE | Admit: 2021-07-21 | Discharge: 2021-07-21 | Disposition: A | Discharge status: Home / Self Care | Payer: BC Managed Care – PPO | Source: Ambulatory Visit | Attending: Radiation Oncology | Admitting: Radiation Oncology

## 2021-07-21 DIAGNOSIS — Z510 Encounter for antineoplastic radiation therapy: Secondary | ICD-10-CM | POA: Diagnosis not present

## 2021-07-21 DIAGNOSIS — C711 Malignant neoplasm of frontal lobe: Secondary | ICD-10-CM | POA: Diagnosis not present

## 2021-07-22 ENCOUNTER — Ambulatory Visit: Payer: BC Managed Care – PPO | Admitting: Radiation Oncology

## 2021-07-22 ENCOUNTER — Ambulatory Visit
Admission: RE | Admit: 2021-07-22 | Discharge: 2021-07-22 | Disposition: A | Discharge status: Home / Self Care | Payer: BC Managed Care – PPO | Source: Ambulatory Visit | Attending: Radiation Oncology | Admitting: Radiation Oncology

## 2021-07-22 ENCOUNTER — Inpatient Hospital Stay (HOSPITAL_BASED_OUTPATIENT_CLINIC_OR_DEPARTMENT_OTHER): Payer: BC Managed Care – PPO | Admitting: Internal Medicine

## 2021-07-22 VITALS — BP 125/93 | HR 71 | Temp 98.5°F | Resp 18 | Ht 65.0 in | Wt 195.1 lb

## 2021-07-22 DIAGNOSIS — Z510 Encounter for antineoplastic radiation therapy: Secondary | ICD-10-CM | POA: Diagnosis not present

## 2021-07-22 DIAGNOSIS — Z79899 Other long term (current) drug therapy: Secondary | ICD-10-CM | POA: Diagnosis not present

## 2021-07-22 DIAGNOSIS — C711 Malignant neoplasm of frontal lobe: Secondary | ICD-10-CM | POA: Diagnosis not present

## 2021-07-22 DIAGNOSIS — C712 Malignant neoplasm of temporal lobe: Secondary | ICD-10-CM | POA: Diagnosis not present

## 2021-07-22 DIAGNOSIS — C719 Malignant neoplasm of brain, unspecified: Secondary | ICD-10-CM

## 2021-07-22 DIAGNOSIS — R569 Unspecified convulsions: Secondary | ICD-10-CM | POA: Diagnosis not present

## 2021-07-22 NOTE — Progress Notes (Signed)
[facility] [address] [place] [place], [place] [address] [phone]   Interval Evaluation  Date of Service: 07/22/21 Patient Name: Stacy Gutierrez Patient MRN: [id number] Patient DOB: [birth year] Provider: Ventura Sellers, MD  Identifying Statement:  Stacy Gutierrez is a 30 y.o. 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    Referring Provider: Debbrah Alar, NP [address] [place],  [place] [address]  Oncologic History: Oncology History  Astrocytoma ([place])  04/05/2018 Surgery   Craniotomy, resection with Dr. Tommi Rumps at [facility].  Path demonstrates WHO 2 diffuse astrocytoma, IDH mt   06/12/2021 Progression   Progression of disease #1   07/09/2021 Procedure   Oocyte retrieval at [facility], prior to initiation of chemotherapy   07/21/2021 -  Radiation Therapy   IMRT and concurrent Temodar with Dr. Isidore Moos    Biomarkers:  MGMT Unknown.  IDH 1/2 Mutated.  EGFR Unknown  TERT Unmutated   Interval History: Stacy Gutierrez presents today for follow up, having just initiated radiation therapy and concurrent Temodar this week.  She underwent successful egg retrieval procedure at [facility] on 9/16.  No issues thus far with radiation or chemo.  Denies further seizures, headaches.    H+P (06/22/21) Patient presents today as referral from Dr. Ferdinand Lango at [facility], for local radiation and chemotherapy for progressive glioma.  Patient describes breakthrough seizure episode earlier this month, leading [place] [facility].  Vimpat was poorly tolerated, so Lamictal titration was initiated.  She is currently up to 44m BID, with plans to increase to 1014mBID. Also on Keppra 1500/2000.  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  [facility] team is recommending radiation and  Temodar given recent progression noted on MRI.  No exposure to RT or chemo since diagnosis in 2019.  Medications: Current Outpatient Medications on File Prior to Visit  Medication Sig Dispense Refill   lamoTRIgine (LAMICTAL) 100 MG tablet Take 1 tablet (100 mg total) by mouth daily. 60 tablet 2   levETIRAcetam (KEPPRA) 1000 MG tablet 150063my mouth twice daily (Patient not taking: Reported on 06/22/2021)     levETIRAcetam (KEPPRA) 1000 MG tablet Take 1 tablet by mouth See admin instructions. 1500 mg by mouth every morning and 2000 mg at bedtime     levonorgestrel (MIRENA, 52 MG,) 20 MCG/DAY IUD 1 each by Intrauterine route once for 1 dose. 1 each 0   LORazepam (ATIVAN) 1 MG tablet Take 1 mg by mouth daily as needed.     ondansetron (ZOFRAN) 8 MG tablet Take 1 tablet (8 mg total) by mouth 2 (two) times daily as needed (nausea and vomiting). May take 30-60 minutes prior to Temodar administration if nausea/vomiting occurs. 30 tablet 1   temozolomide (TEMODAR) 140 MG capsule Take 1 capsule (140 mg total) by mouth daily. May take on an empty stomach to decrease nausea & vomiting. 42 capsule 0   No current facility-administered medications on file prior to visit.    Allergies: No Known Allergies Past Medical History:  Past Medical History:  Diagnosis Date   [place] ([place]  Mononucleosis 01/22/2010   Seizures ([place]  Vasovagal syncope    Past Surgical History:  Past Surgical History:  Procedure Laterality Date   CRANIOTOMY Left  TIBIA FRACTURE SURGERY  03/24/2010   Tibial Fracture-left leg--plate/screw--Dr. Linton Rump   TONSILLECTOMY     Social History:  Social History   Socioeconomic History   Marital status: Married    Spouse name: Not on file   Number of children: 0   Years of education: Not on file   Highest education level: Not on file  Occupational History   Occupation: Student  Tobacco Use   Smoking status: Never   Smokeless tobacco: Never  Vaping Use   Vaping Use:  Never used  Substance and Sexual Activity   Alcohol use: No   Drug use: Not on file   Sexual activity: Yes    Partners: Male    Birth control/protection: I.U.D.  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   Social Determinants of Health   Financial Resource Strain: Not on file  Food Insecurity: Not on file  Transportation Needs: Not on file  Physical Activity: Not on file  Stress: Not on file  Social Connections: Not on file  Intimate Partner Violence: Not on file   Family History:  Family History  Problem Relation Age of Onset   Bladder Cancer Mother    Diabetes type II Maternal Grandmother        "heart issues"   Peripheral vascular disease Maternal Grandmother    Coronary artery disease Other    Diabetes Other    Hyperlipidemia Other    Stroke Other     Review of Systems: Constitutional: Doesn't report fevers, chills or abnormal weight loss Eyes: Doesn't report blurriness of vision Ears, nose, mouth, throat, and face: Doesn't report sore throat Respiratory: Doesn't report cough, dyspnea or wheezes Cardiovascular: Doesn't report palpitation, chest discomfort  Gastrointestinal:  Doesn't report nausea, constipation, diarrhea GU: Doesn't report incontinence Skin: Doesn't report skin rashes Neurological: Per HPI Musculoskeletal: Doesn't report joint pain Behavioral/Psych: Doesn't report anxiety  Physical Exam: Vitals:   07/22/21 0957  BP: (!) 125/93  Pulse: 71  Resp: 18  Temp: 98.5 F (36.9 C)  SpO2: 99%    KPS: 90. General: Alert, cooperative, pleasant, in no acute distress Head: Normal EENT: No conjunctival injection or scleral icterus.  Lungs: Resp effort normal Cardiac: Regular rate Abdomen: Non-distended abdomen Skin: No rashes cyanosis or petechiae. Extremities: No clubbing or edema  Neurologic Exam: Mental Status: Awake, alert, attentive  to examiner. Oriented to self and environment. Language is fluent with intact comprehension.  Cranial Nerves: Visual acuity is grossly normal. Visual fields are full. Extra-ocular movements intact. No ptosis. Face is symmetric Motor: Tone and bulk are normal. Power is full in both arms and legs. Reflexes are symmetric, no pathologic reflexes present.  Sensory: Intact to light touch Gait: Normal.   Labs: I have reviewed the data as listed    Component Value Date/Time   NA 139 05/25/2021 0935   K 4.1 05/25/2021 0935   CL 103 05/25/2021 0935   CO2 27 05/25/2021 0935   GLUCOSE 79 05/25/2021 0935   BUN 15 05/25/2021 0935   CREATININE 0.77 05/25/2021 0935   CALCIUM 9.5 05/25/2021 0935   PROT 7.2 05/25/2021 0935   ALBUMIN 4.6 05/25/2021 0935   AST 19 05/25/2021 0935   ALT 25 05/25/2021 0935   ALKPHOS 56 05/25/2021 0935   BILITOT 0.4 05/25/2021 0935   Lab Results  Component Value Date   WBC 12.9 02/11/2010  NEUTROABS 9.0 (H) 02/11/2010   HGB 14.1 02/11/2010   HCT 42.5 02/11/2010   MCV 87.6 02/11/2010   PLT 310 02/11/2010    Imaging: 06/12/21    Assessment/Plan Seizures ([place])  Astrocytoma ([place])  Stacy Gutierrez is clinically stable today.   We ultimately recommended continuing with course of intensity modulated radiation therapy and concurrent daily Temozolomide.  Radiation will be administered Mon-Fri over 6 weeks, Temodar will be dosed at 22m/m2 to be given daily over 42 days.  We reviewed side effects of temodar, including fatigue, nausea/vomiting, constipation, and cytopenias.  Chemotherapy should be held for the following:  ANC less than 1,000  Platelets less than 100,000  LFT or creatinine greater than 2x ULN  If clinical concerns/contraindications develop  Every 2 weeks during radiation, labs will be checked accompanied by a clinical evaluation in the brain tumor clinic.  For seizures, she will continue on Keppra and Lamictal titration as ordered.  All  questions were answered. The patient knows to call the clinic with any problems, questions or concerns. No barriers to learning were detected.  The total time spent in the encounter was 30 minutes and more than 50% was on counseling and review of test results   ZVentura Sellers MD Medical Director of Neuro-Oncology [facility]09/29/22 9:42 AM

## 2021-07-23 ENCOUNTER — Ambulatory Visit
Admission: RE | Admit: 2021-07-23 | Discharge: 2021-07-23 | Disposition: A | Discharge status: Home / Self Care | Payer: BC Managed Care – PPO | Source: Ambulatory Visit | Attending: Radiation Oncology | Admitting: Radiation Oncology

## 2021-07-23 ENCOUNTER — Telehealth: Payer: Self-pay

## 2021-07-23 ENCOUNTER — Other Ambulatory Visit: Payer: Self-pay

## 2021-07-23 DIAGNOSIS — Z510 Encounter for antineoplastic radiation therapy: Secondary | ICD-10-CM | POA: Diagnosis not present

## 2021-07-23 DIAGNOSIS — C711 Malignant neoplasm of frontal lobe: Secondary | ICD-10-CM | POA: Diagnosis not present

## 2021-07-23 NOTE — Telephone Encounter (Signed)
Patient notified of completion of FMLA Forms. Fax transmission confirmation received and copy of forms placed at Registration for pick-up as requested.

## 2021-07-26 ENCOUNTER — Other Ambulatory Visit: Payer: Self-pay

## 2021-07-26 ENCOUNTER — Ambulatory Visit
Admission: RE | Admit: 2021-07-26 | Discharge: 2021-07-26 | Disposition: A | Discharge status: Home / Self Care | Payer: BC Managed Care – PPO | Source: Ambulatory Visit | Attending: Radiation Oncology | Admitting: Radiation Oncology

## 2021-07-26 ENCOUNTER — Telehealth: Payer: Self-pay | Admitting: Pharmacist

## 2021-07-26 DIAGNOSIS — C711 Malignant neoplasm of frontal lobe: Secondary | ICD-10-CM | POA: Insufficient documentation

## 2021-07-26 DIAGNOSIS — Z79899 Other long term (current) drug therapy: Secondary | ICD-10-CM | POA: Diagnosis not present

## 2021-07-26 DIAGNOSIS — R5383 Other fatigue: Secondary | ICD-10-CM | POA: Diagnosis not present

## 2021-07-26 DIAGNOSIS — Z510 Encounter for antineoplastic radiation therapy: Secondary | ICD-10-CM | POA: Insufficient documentation

## 2021-07-26 DIAGNOSIS — G43909 Migraine, unspecified, not intractable, without status migrainosus: Secondary | ICD-10-CM | POA: Diagnosis not present

## 2021-07-26 DIAGNOSIS — C712 Malignant neoplasm of temporal lobe: Secondary | ICD-10-CM | POA: Diagnosis not present

## 2021-07-26 NOTE — Telephone Encounter (Signed)
Oral Chemotherapy Pharmacist Encounter   Spoke with patient today to follow up regarding patient's oral chemotherapy medication: Temodar (temozolomide) as well as patient's anti-nausea medication, ondansetron.   Explained to patient that only a partial amount (qty 21 capsules) of Temodar (temozolomide) was sent from [place] due to insurance restrictions. Explained to patient that the remaining 21 capsules will be sent from [facility]. Encouraged patient to call [facility] later this week, and at the latest on 08/02/21 to ensure Temodar is delivered prior to patient running out of current supply. Telephone number provided to patient (tele: [phone], opt 1 for oncology department).  Additionally, noted that [facility] had only provided patient with a qty of 9 tablets of ondansetron on 07/19/21. Patient aware that she will need to call [facility] for the remaining amount left on the original prescription sent for a total qty of 30 tablets.   Patient expressed understanding. Patient knows to call the office with questions or concerns.  Leron Croak, PharmD, BCPS Hematology/Oncology Clinical Pharmacist [facility] [phone] 07/26/2021 2:40 PM

## 2021-07-26 NOTE — Progress Notes (Signed)
Pt here for patient teaching.  Pt given Radiation and You booklet, Managing Acute Radiation Side Effects for Head and Neck Cancer handout, skin care instructions, and Sonafine.  Reviewed areas of pertinence such as diarrhea, fatigue, hair loss, mouth changes, nausea and vomiting, skin changes, throat changes, headache, blurry vision, and taste changes . Pt able to give teach back of use unscented/gentle soap, have Imodium on hand, and drink plenty of water,apply Sonafine bid and avoid applying anything to skin within 4 hours of treatment. Pt verbalizes understanding of information given and will contact nursing with any questions or concerns.     [email or web address]

## 2021-07-27 ENCOUNTER — Ambulatory Visit
Admission: RE | Admit: 2021-07-27 | Discharge: 2021-07-27 | Disposition: A | Discharge status: Home / Self Care | Payer: BC Managed Care – PPO | Source: Ambulatory Visit | Attending: Radiation Oncology | Admitting: Radiation Oncology

## 2021-07-27 DIAGNOSIS — G43909 Migraine, unspecified, not intractable, without status migrainosus: Secondary | ICD-10-CM | POA: Diagnosis not present

## 2021-07-27 DIAGNOSIS — Z79899 Other long term (current) drug therapy: Secondary | ICD-10-CM | POA: Diagnosis not present

## 2021-07-27 DIAGNOSIS — Z510 Encounter for antineoplastic radiation therapy: Secondary | ICD-10-CM | POA: Diagnosis not present

## 2021-07-27 DIAGNOSIS — R5383 Other fatigue: Secondary | ICD-10-CM | POA: Diagnosis not present

## 2021-07-27 DIAGNOSIS — C711 Malignant neoplasm of frontal lobe: Secondary | ICD-10-CM | POA: Diagnosis not present

## 2021-07-27 DIAGNOSIS — C712 Malignant neoplasm of temporal lobe: Secondary | ICD-10-CM | POA: Diagnosis not present

## 2021-07-28 ENCOUNTER — Ambulatory Visit
Admission: RE | Admit: 2021-07-28 | Discharge: 2021-07-28 | Disposition: A | Discharge status: Home / Self Care | Payer: BC Managed Care – PPO | Source: Ambulatory Visit | Attending: Radiation Oncology | Admitting: Radiation Oncology

## 2021-07-28 ENCOUNTER — Other Ambulatory Visit: Payer: Self-pay

## 2021-07-28 DIAGNOSIS — C712 Malignant neoplasm of temporal lobe: Secondary | ICD-10-CM | POA: Diagnosis not present

## 2021-07-28 DIAGNOSIS — R5383 Other fatigue: Secondary | ICD-10-CM | POA: Diagnosis not present

## 2021-07-28 DIAGNOSIS — C711 Malignant neoplasm of frontal lobe: Secondary | ICD-10-CM | POA: Diagnosis not present

## 2021-07-28 DIAGNOSIS — Z79899 Other long term (current) drug therapy: Secondary | ICD-10-CM | POA: Diagnosis not present

## 2021-07-28 DIAGNOSIS — G43909 Migraine, unspecified, not intractable, without status migrainosus: Secondary | ICD-10-CM | POA: Diagnosis not present

## 2021-07-28 DIAGNOSIS — Z510 Encounter for antineoplastic radiation therapy: Secondary | ICD-10-CM | POA: Diagnosis not present

## 2021-07-29 ENCOUNTER — Ambulatory Visit
Admission: RE | Admit: 2021-07-29 | Discharge: 2021-07-29 | Disposition: A | Discharge status: Home / Self Care | Payer: BC Managed Care – PPO | Source: Ambulatory Visit | Attending: Radiation Oncology | Admitting: Radiation Oncology

## 2021-07-29 DIAGNOSIS — Z79899 Other long term (current) drug therapy: Secondary | ICD-10-CM | POA: Diagnosis not present

## 2021-07-29 DIAGNOSIS — C712 Malignant neoplasm of temporal lobe: Secondary | ICD-10-CM | POA: Diagnosis not present

## 2021-07-29 DIAGNOSIS — Z510 Encounter for antineoplastic radiation therapy: Secondary | ICD-10-CM | POA: Diagnosis not present

## 2021-07-29 DIAGNOSIS — R5383 Other fatigue: Secondary | ICD-10-CM | POA: Diagnosis not present

## 2021-07-29 DIAGNOSIS — G43909 Migraine, unspecified, not intractable, without status migrainosus: Secondary | ICD-10-CM | POA: Diagnosis not present

## 2021-07-29 DIAGNOSIS — C711 Malignant neoplasm of frontal lobe: Secondary | ICD-10-CM | POA: Diagnosis not present

## 2021-07-30 ENCOUNTER — Other Ambulatory Visit: Payer: Self-pay

## 2021-07-30 ENCOUNTER — Ambulatory Visit
Admission: RE | Admit: 2021-07-30 | Discharge: 2021-07-30 | Disposition: A | Discharge status: Home / Self Care | Payer: BC Managed Care – PPO | Source: Ambulatory Visit | Attending: Radiation Oncology | Admitting: Radiation Oncology

## 2021-07-30 DIAGNOSIS — C712 Malignant neoplasm of temporal lobe: Secondary | ICD-10-CM | POA: Diagnosis not present

## 2021-07-30 DIAGNOSIS — R5383 Other fatigue: Secondary | ICD-10-CM | POA: Diagnosis not present

## 2021-07-30 DIAGNOSIS — Z79899 Other long term (current) drug therapy: Secondary | ICD-10-CM | POA: Diagnosis not present

## 2021-07-30 DIAGNOSIS — Z510 Encounter for antineoplastic radiation therapy: Secondary | ICD-10-CM | POA: Diagnosis not present

## 2021-07-30 DIAGNOSIS — G43909 Migraine, unspecified, not intractable, without status migrainosus: Secondary | ICD-10-CM | POA: Diagnosis not present

## 2021-07-30 DIAGNOSIS — C711 Malignant neoplasm of frontal lobe: Secondary | ICD-10-CM | POA: Diagnosis not present

## 2021-08-02 ENCOUNTER — Ambulatory Visit
Admission: RE | Admit: 2021-08-02 | Discharge: 2021-08-02 | Disposition: A | Discharge status: Home / Self Care | Payer: BC Managed Care – PPO | Source: Ambulatory Visit | Attending: Radiation Oncology | Admitting: Radiation Oncology

## 2021-08-02 ENCOUNTER — Other Ambulatory Visit: Payer: Self-pay

## 2021-08-02 DIAGNOSIS — Z510 Encounter for antineoplastic radiation therapy: Secondary | ICD-10-CM | POA: Diagnosis not present

## 2021-08-02 DIAGNOSIS — C711 Malignant neoplasm of frontal lobe: Secondary | ICD-10-CM | POA: Diagnosis not present

## 2021-08-02 DIAGNOSIS — R5383 Other fatigue: Secondary | ICD-10-CM | POA: Diagnosis not present

## 2021-08-02 DIAGNOSIS — G43909 Migraine, unspecified, not intractable, without status migrainosus: Secondary | ICD-10-CM | POA: Diagnosis not present

## 2021-08-02 DIAGNOSIS — C712 Malignant neoplasm of temporal lobe: Secondary | ICD-10-CM | POA: Diagnosis not present

## 2021-08-02 DIAGNOSIS — Z79899 Other long term (current) drug therapy: Secondary | ICD-10-CM | POA: Diagnosis not present

## 2021-08-03 ENCOUNTER — Other Ambulatory Visit: Payer: Self-pay | Admitting: *Deleted

## 2021-08-03 ENCOUNTER — Inpatient Hospital Stay (HOSPITAL_BASED_OUTPATIENT_CLINIC_OR_DEPARTMENT_OTHER): Payer: BC Managed Care – PPO | Admitting: Internal Medicine

## 2021-08-03 ENCOUNTER — Inpatient Hospital Stay: Payer: BC Managed Care – PPO | Attending: Internal Medicine

## 2021-08-03 ENCOUNTER — Ambulatory Visit
Admission: RE | Admit: 2021-08-03 | Discharge: 2021-08-03 | Disposition: A | Discharge status: Home / Self Care | Payer: BC Managed Care – PPO | Source: Ambulatory Visit | Attending: Radiation Oncology | Admitting: Radiation Oncology

## 2021-08-03 VITALS — BP 129/86 | HR 100 | Temp 97.9°F | Resp 18 | Wt 194.1 lb

## 2021-08-03 DIAGNOSIS — Z79899 Other long term (current) drug therapy: Secondary | ICD-10-CM | POA: Diagnosis not present

## 2021-08-03 DIAGNOSIS — C719 Malignant neoplasm of brain, unspecified: Secondary | ICD-10-CM | POA: Diagnosis not present

## 2021-08-03 DIAGNOSIS — R569 Unspecified convulsions: Secondary | ICD-10-CM

## 2021-08-03 DIAGNOSIS — R5383 Other fatigue: Secondary | ICD-10-CM | POA: Insufficient documentation

## 2021-08-03 DIAGNOSIS — C711 Malignant neoplasm of frontal lobe: Secondary | ICD-10-CM | POA: Diagnosis not present

## 2021-08-03 DIAGNOSIS — Z510 Encounter for antineoplastic radiation therapy: Secondary | ICD-10-CM | POA: Diagnosis not present

## 2021-08-03 DIAGNOSIS — G43909 Migraine, unspecified, not intractable, without status migrainosus: Secondary | ICD-10-CM | POA: Insufficient documentation

## 2021-08-03 DIAGNOSIS — C712 Malignant neoplasm of temporal lobe: Secondary | ICD-10-CM | POA: Diagnosis not present

## 2021-08-03 LAB — CMP (CANCER CENTER ONLY)
ALT: 36 U/L (ref 0–44)
AST: 22 U/L (ref 15–41)
Albumin: 4.5 g/dL (ref 3.5–5.0)
Alkaline Phosphatase: 61 U/L (ref 38–126)
Anion gap: 9 (ref 5–15)
BUN: 15 mg/dL (ref 6–20)
CO2: 28 mmol/L (ref 22–32)
Calcium: 10 mg/dL (ref 8.9–10.3)
Chloride: 103 mmol/L (ref 98–111)
Creatinine: 0.96 mg/dL (ref 0.44–1.00)
GFR, Estimated: >60 mL/min (ref >60)
Glucose, Bld: 107 mg/dL — ABNORMAL HIGH (ref 70–99)
Potassium: 4.1 mmol/L (ref 3.5–5.1)
Sodium: 140 mmol/L (ref 135–145)
Total Bilirubin: 0.6 mg/dL (ref 0.3–1.2)
Total Protein: 7.5 g/dL (ref 6.5–8.1)

## 2021-08-03 LAB — CBC WITH DIFFERENTIAL (CANCER CENTER ONLY)
Abs Immature Granulocytes: 0.01 10*3/uL (ref 0.00–0.07)
Basophils Absolute: 0.1 10*3/uL (ref 0.0–0.1)
Basophils Relative: 1 %
Eosinophils Absolute: 0.2 10*3/uL (ref 0.0–0.5)
Eosinophils Relative: 4 %
HCT: 40.2 % (ref 36.0–46.0)
Hemoglobin: 13.6 g/dL (ref 12.0–15.0)
Immature Granulocytes: 0 %
Lymphocytes Relative: 25 %
Lymphs Abs: 1.4 10*3/uL (ref 0.7–4.0)
MCH: 30 pg (ref 26.0–34.0)
MCHC: 33.8 g/dL (ref 30.0–36.0)
MCV: 88.7 fL (ref 80.0–100.0)
Monocytes Absolute: 0.5 10*3/uL (ref 0.1–1.0)
Monocytes Relative: 8 %
Neutro Abs: 3.6 10*3/uL (ref 1.7–7.7)
Neutrophils Relative %: 62 %
Platelet Count: 268 10*3/uL (ref 150–400)
RBC: 4.53 MIL/uL (ref 3.87–5.11)
RDW: 11.5 % (ref 11.5–15.5)
WBC Count: 5.7 10*3/uL (ref 4.0–10.5)
nRBC: 0 % (ref 0.0–0.2)

## 2021-08-03 NOTE — Progress Notes (Signed)
[facility] at [address] [place], [place] [address] [phone]   Interval Evaluation  Date of Service: 08/03/21 Patient Name: Stacy Gutierrez Patient MRN: [id number] Patient DOB: 05/31/1991 Provider: Ventura Sellers, MD  Identifying Statement:  Stacy Gutierrez is a 30 y.o. 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    Referring Provider: Debbrah Alar, NP [address] [place],   [id number]  Oncologic History: Oncology History  Astrocytoma ([place])  04/05/2018 Surgery   Craniotomy, resection with Dr. Tommi Rumps at [facility].  Path demonstrates WHO 2 diffuse astrocytoma, IDH mt   06/12/2021 Progression   Progression of disease #1   07/09/2021 Procedure   Oocyte retrieval at [facility], prior to initiation of chemotherapy   07/21/2021 -  Radiation Therapy   IMRT and concurrent Temodar with Dr. Isidore Moos    Biomarkers:  MGMT Unknown.  IDH 1/2 Mutated.  EGFR Unknown  TERT Unmutated   Interval History: Stacy Gutierrez presents today for follow up, now having completed 2 weeks of IMRT and Temozolomide.  No new or progressive neurologic deficits, no issues thus far with radiation or chemo.  Denies further seizures, headaches.    H+P (06/22/21) Patient presents today as referral from Dr. Ferdinand Lango at [facility], for local radiation and chemotherapy for progressive glioma.  Patient describes breakthrough seizure episode earlier this month, leading [place] [facility].  Vimpat was poorly tolerated, so Lamictal titration was initiated.  She is currently up to 71m BID, with plans to increase to 1038mBID. Also on Keppra 1500/2000.  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  [facility] team is recommending radiation and Temodar given recent progression noted on  MRI.  No exposure to RT or chemo since diagnosis in 2019.  Medications: Current Outpatient Medications on File Prior to Visit  Medication Sig Dispense Refill   lamoTRIgine (LAMICTAL) 100 MG tablet Take 1 tablet (100 mg total) by mouth daily. 60 tablet 2   levETIRAcetam (KEPPRA) 1000 MG tablet 150055my mouth twice daily (Patient not taking: Reported on 06/22/2021)     levETIRAcetam (KEPPRA) 1000 MG tablet Take 1 tablet by mouth See admin instructions. 1500 mg by mouth every morning and 2000 mg at bedtime     levonorgestrel (MIRENA, 52 MG,) 20 MCG/DAY IUD 1 each by Intrauterine route once for 1 dose. 1 each 0   LORazepam (ATIVAN) 1 MG tablet Take 1 mg by mouth daily as needed.     ondansetron (ZOFRAN) 8 MG tablet Take 1 tablet (8 mg total) by mouth 2 (two) times daily as needed (nausea and vomiting). May take 30-60 minutes prior to Temodar administration if nausea/vomiting occurs. 30 tablet 1   temozolomide (TEMODAR) 140 MG capsule Take 1 capsule (140 mg total) by mouth daily. May take on an empty stomach to decrease nausea & vomiting. 42 capsule 0   No current facility-administered medications on file prior to visit.    Allergies: No Known Allergies Past Medical History:  Past Medical History:  Diagnosis Date   Astrocytoma ([facility]  Mononucleosis 01/22/2010   Seizures ([facility]  Vasovagal syncope    Past Surgical History:  Past Surgical History:  Procedure Laterality Date   CRANIOTOMY Left    TIBIA FRACTURE SURGERY  03/24/2010   Tibial Fracture-left leg--plate/screw--Dr. Linton Rump   TONSILLECTOMY     Social History:  Social History   Socioeconomic History   Marital status: Married    Spouse name: Not on file   Number of children: 0   Years of education: Not on file   Highest education level: Not on file  Occupational History   Occupation: Student  Tobacco Use   Smoking status: Never   Smokeless tobacco: Never  Vaping Use   Vaping Use: Never used  Substance and Sexual Activity    Alcohol use: No   Drug use: Not on file   Sexual activity: Yes    Partners: Male    Birth control/protection: I.U.D.  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   Social Determinants of Health   Financial Resource Strain: Not on file  Food Insecurity: Not on file  Transportation Needs: Not on file  Physical Activity: Not on file  Stress: Not on file  Social Connections: Not on file  Intimate Partner Violence: Not on file   Family History:  Family History  Problem Relation Age of Onset   Bladder Cancer Mother    Diabetes type II Maternal Grandmother        "heart issues"   Peripheral vascular disease Maternal Grandmother    Coronary artery disease Other    Diabetes Other    Hyperlipidemia Other    Stroke Other     Review of Systems: Constitutional: Doesn't report fevers, chills or abnormal weight loss Eyes: Doesn't report blurriness of vision Ears, nose, mouth, throat, and face: Doesn't report sore throat Respiratory: Doesn't report cough, dyspnea or wheezes Cardiovascular: Doesn't report palpitation, chest discomfort  Gastrointestinal:  Doesn't report nausea, constipation, diarrhea GU: Doesn't report incontinence Skin: Doesn't report skin rashes Neurological: Per HPI Musculoskeletal: Doesn't report joint pain Behavioral/Psych: Doesn't report anxiety  Physical Exam: There were no vitals filed for this visit.   KPS: 90. General: Alert, cooperative, pleasant, in no acute distress Head: Normal EENT: No conjunctival injection or scleral icterus.  Lungs: Resp effort normal Cardiac: Regular rate Abdomen: Non-distended abdomen Skin: No rashes cyanosis or petechiae. Extremities: No clubbing or edema  Neurologic Exam: Mental Status: Awake, alert, attentive to examiner. Oriented to self and environment. Language is fluent with intact comprehension.  Cranial  Nerves: Visual acuity is grossly normal. Visual fields are full. Extra-ocular movements intact. No ptosis. Face is symmetric Motor: Tone and bulk are normal. Power is full in both arms and legs. Reflexes are symmetric, no pathologic reflexes present.  Sensory: Intact to light touch Gait: Normal.   Labs: I have reviewed the data as listed    Component Value Date/Time   NA 139 05/25/2021 0935   K 4.1 05/25/2021 0935   CL 103 05/25/2021 0935   CO2 27 05/25/2021 0935   GLUCOSE 79 05/25/2021 0935   BUN 15 05/25/2021 0935   CREATININE 0.77 05/25/2021 0935   CALCIUM 9.5 05/25/2021 0935   PROT 7.2 05/25/2021 0935   ALBUMIN 4.6 05/25/2021 0935   AST 19 05/25/2021 0935   ALT 25 05/25/2021 0935   ALKPHOS 56 05/25/2021 0935   BILITOT 0.4 05/25/2021 0935   Lab Results  Component Value Date   WBC 12.9 02/11/2010   NEUTROABS 9.0 (H) 02/11/2010   HGB 14.1 02/11/2010   HCT 42.5 02/11/2010   MCV 87.6 02/11/2010  PLT 310 02/11/2010    Imaging: 06/12/21    Assessment/Plan Astrocytoma (HCC)  Seizures ([place])  Stacy Gutierrez is clinically stable today, now having completed first two week of IMRT and Temozolomide.  No new or progressive changes.  We ultimately recommended continuing with course of intensity modulated radiation therapy and concurrent daily Temozolomide.  Radiation will be administered Mon-Fri over 6 weeks, Temodar will be dosed at 54m/m2 to be given daily over 42 days.  We reviewed side effects of temodar, including fatigue, nausea/vomiting, constipation, and cytopenias.  Chemotherapy should be held for the following:  ANC less than 1,000  Platelets less than 100,000  LFT or creatinine greater than 2x ULN  If clinical concerns/contraindications develop  Every 2 weeks during radiation, labs will be checked accompanied by a clinical evaluation in the brain tumor clinic.  For seizures, she will continue on Keppra and Lamictal titration as ordered.  May dose  sumatriptan, NSAIDs or Tylenol for breakthrough migraines.  All questions were answered. The patient knows to call the clinic with any problems, questions or concerns. No barriers to learning were detected.  The total time spent in the encounter was 30 minutes and more than 50% was on counseling and review of test results   ZVentura Sellers MD Medical Director of Neuro-Oncology [facility] WTroutdale10/11/22 9:20 AM

## 2021-08-04 ENCOUNTER — Ambulatory Visit
Admission: RE | Admit: 2021-08-04 | Discharge: 2021-08-04 | Disposition: A | Discharge status: Home / Self Care | Payer: BC Managed Care – PPO | Source: Ambulatory Visit | Attending: Radiation Oncology | Admitting: Radiation Oncology

## 2021-08-04 ENCOUNTER — Other Ambulatory Visit: Payer: Self-pay

## 2021-08-04 DIAGNOSIS — Z510 Encounter for antineoplastic radiation therapy: Secondary | ICD-10-CM | POA: Diagnosis not present

## 2021-08-04 DIAGNOSIS — C711 Malignant neoplasm of frontal lobe: Secondary | ICD-10-CM | POA: Diagnosis not present

## 2021-08-04 DIAGNOSIS — G43909 Migraine, unspecified, not intractable, without status migrainosus: Secondary | ICD-10-CM | POA: Diagnosis not present

## 2021-08-04 DIAGNOSIS — Z79899 Other long term (current) drug therapy: Secondary | ICD-10-CM | POA: Diagnosis not present

## 2021-08-04 DIAGNOSIS — C712 Malignant neoplasm of temporal lobe: Secondary | ICD-10-CM | POA: Diagnosis not present

## 2021-08-04 DIAGNOSIS — R5383 Other fatigue: Secondary | ICD-10-CM | POA: Diagnosis not present

## 2021-08-05 ENCOUNTER — Ambulatory Visit
Admission: RE | Admit: 2021-08-05 | Discharge: 2021-08-05 | Disposition: A | Discharge status: Home / Self Care | Payer: BC Managed Care – PPO | Source: Ambulatory Visit | Attending: Radiation Oncology | Admitting: Radiation Oncology

## 2021-08-05 DIAGNOSIS — C712 Malignant neoplasm of temporal lobe: Secondary | ICD-10-CM | POA: Diagnosis not present

## 2021-08-05 DIAGNOSIS — G43909 Migraine, unspecified, not intractable, without status migrainosus: Secondary | ICD-10-CM | POA: Diagnosis not present

## 2021-08-05 DIAGNOSIS — R5383 Other fatigue: Secondary | ICD-10-CM | POA: Diagnosis not present

## 2021-08-05 DIAGNOSIS — Z79899 Other long term (current) drug therapy: Secondary | ICD-10-CM | POA: Diagnosis not present

## 2021-08-05 DIAGNOSIS — Z510 Encounter for antineoplastic radiation therapy: Secondary | ICD-10-CM | POA: Diagnosis not present

## 2021-08-05 DIAGNOSIS — C711 Malignant neoplasm of frontal lobe: Secondary | ICD-10-CM | POA: Diagnosis not present

## 2021-08-06 ENCOUNTER — Ambulatory Visit
Admission: RE | Admit: 2021-08-06 | Discharge: 2021-08-06 | Disposition: A | Discharge status: Home / Self Care | Payer: BC Managed Care – PPO | Source: Ambulatory Visit | Attending: Radiation Oncology | Admitting: Radiation Oncology

## 2021-08-06 ENCOUNTER — Other Ambulatory Visit: Payer: Self-pay

## 2021-08-06 DIAGNOSIS — C712 Malignant neoplasm of temporal lobe: Secondary | ICD-10-CM | POA: Diagnosis not present

## 2021-08-06 DIAGNOSIS — Z79899 Other long term (current) drug therapy: Secondary | ICD-10-CM | POA: Diagnosis not present

## 2021-08-06 DIAGNOSIS — Z510 Encounter for antineoplastic radiation therapy: Secondary | ICD-10-CM | POA: Diagnosis not present

## 2021-08-06 DIAGNOSIS — C711 Malignant neoplasm of frontal lobe: Secondary | ICD-10-CM | POA: Diagnosis not present

## 2021-08-06 DIAGNOSIS — G43909 Migraine, unspecified, not intractable, without status migrainosus: Secondary | ICD-10-CM | POA: Diagnosis not present

## 2021-08-06 DIAGNOSIS — R5383 Other fatigue: Secondary | ICD-10-CM | POA: Diagnosis not present

## 2021-08-09 ENCOUNTER — Ambulatory Visit
Admission: RE | Admit: 2021-08-09 | Discharge: 2021-08-09 | Disposition: A | Discharge status: Home / Self Care | Payer: BC Managed Care – PPO | Source: Ambulatory Visit | Attending: Radiation Oncology | Admitting: Radiation Oncology

## 2021-08-09 ENCOUNTER — Other Ambulatory Visit: Payer: Self-pay

## 2021-08-09 DIAGNOSIS — G43909 Migraine, unspecified, not intractable, without status migrainosus: Secondary | ICD-10-CM | POA: Diagnosis not present

## 2021-08-09 DIAGNOSIS — C711 Malignant neoplasm of frontal lobe: Secondary | ICD-10-CM | POA: Diagnosis not present

## 2021-08-09 DIAGNOSIS — Z510 Encounter for antineoplastic radiation therapy: Secondary | ICD-10-CM | POA: Diagnosis not present

## 2021-08-09 DIAGNOSIS — C712 Malignant neoplasm of temporal lobe: Secondary | ICD-10-CM | POA: Diagnosis not present

## 2021-08-09 DIAGNOSIS — Z79899 Other long term (current) drug therapy: Secondary | ICD-10-CM | POA: Diagnosis not present

## 2021-08-09 DIAGNOSIS — R5383 Other fatigue: Secondary | ICD-10-CM | POA: Diagnosis not present

## 2021-08-10 ENCOUNTER — Ambulatory Visit
Admission: RE | Admit: 2021-08-10 | Discharge: 2021-08-10 | Disposition: A | Discharge status: Home / Self Care | Payer: BC Managed Care – PPO | Source: Ambulatory Visit | Attending: Radiation Oncology | Admitting: Radiation Oncology

## 2021-08-10 DIAGNOSIS — C712 Malignant neoplasm of temporal lobe: Secondary | ICD-10-CM | POA: Diagnosis not present

## 2021-08-10 DIAGNOSIS — Z510 Encounter for antineoplastic radiation therapy: Secondary | ICD-10-CM | POA: Diagnosis not present

## 2021-08-10 DIAGNOSIS — R5383 Other fatigue: Secondary | ICD-10-CM | POA: Diagnosis not present

## 2021-08-10 DIAGNOSIS — G43909 Migraine, unspecified, not intractable, without status migrainosus: Secondary | ICD-10-CM | POA: Diagnosis not present

## 2021-08-10 DIAGNOSIS — C711 Malignant neoplasm of frontal lobe: Secondary | ICD-10-CM | POA: Diagnosis not present

## 2021-08-10 DIAGNOSIS — Z79899 Other long term (current) drug therapy: Secondary | ICD-10-CM | POA: Diagnosis not present

## 2021-08-11 ENCOUNTER — Ambulatory Visit
Admission: RE | Admit: 2021-08-11 | Discharge: 2021-08-11 | Disposition: A | Discharge status: Home / Self Care | Payer: BC Managed Care – PPO | Source: Ambulatory Visit | Attending: Radiation Oncology | Admitting: Radiation Oncology

## 2021-08-11 ENCOUNTER — Other Ambulatory Visit: Payer: Self-pay

## 2021-08-11 DIAGNOSIS — G43909 Migraine, unspecified, not intractable, without status migrainosus: Secondary | ICD-10-CM | POA: Diagnosis not present

## 2021-08-11 DIAGNOSIS — Z510 Encounter for antineoplastic radiation therapy: Secondary | ICD-10-CM | POA: Diagnosis not present

## 2021-08-11 DIAGNOSIS — Z79899 Other long term (current) drug therapy: Secondary | ICD-10-CM | POA: Diagnosis not present

## 2021-08-11 DIAGNOSIS — R5383 Other fatigue: Secondary | ICD-10-CM | POA: Diagnosis not present

## 2021-08-11 DIAGNOSIS — C712 Malignant neoplasm of temporal lobe: Secondary | ICD-10-CM | POA: Diagnosis not present

## 2021-08-11 DIAGNOSIS — C711 Malignant neoplasm of frontal lobe: Secondary | ICD-10-CM | POA: Diagnosis not present

## 2021-08-12 ENCOUNTER — Ambulatory Visit
Admission: RE | Admit: 2021-08-12 | Discharge: 2021-08-12 | Disposition: A | Discharge status: Home / Self Care | Payer: BC Managed Care – PPO | Source: Ambulatory Visit | Attending: Radiation Oncology | Admitting: Radiation Oncology

## 2021-08-12 DIAGNOSIS — C712 Malignant neoplasm of temporal lobe: Secondary | ICD-10-CM | POA: Diagnosis not present

## 2021-08-12 DIAGNOSIS — Z79899 Other long term (current) drug therapy: Secondary | ICD-10-CM | POA: Diagnosis not present

## 2021-08-12 DIAGNOSIS — Z510 Encounter for antineoplastic radiation therapy: Secondary | ICD-10-CM | POA: Diagnosis not present

## 2021-08-12 DIAGNOSIS — R5383 Other fatigue: Secondary | ICD-10-CM | POA: Diagnosis not present

## 2021-08-12 DIAGNOSIS — C711 Malignant neoplasm of frontal lobe: Secondary | ICD-10-CM | POA: Diagnosis not present

## 2021-08-12 DIAGNOSIS — G43909 Migraine, unspecified, not intractable, without status migrainosus: Secondary | ICD-10-CM | POA: Diagnosis not present

## 2021-08-13 ENCOUNTER — Other Ambulatory Visit: Payer: Self-pay

## 2021-08-13 ENCOUNTER — Ambulatory Visit
Admission: RE | Admit: 2021-08-13 | Discharge: 2021-08-13 | Disposition: A | Discharge status: Home / Self Care | Payer: BC Managed Care – PPO | Source: Ambulatory Visit | Attending: Radiation Oncology | Admitting: Radiation Oncology

## 2021-08-13 DIAGNOSIS — Z510 Encounter for antineoplastic radiation therapy: Secondary | ICD-10-CM | POA: Diagnosis not present

## 2021-08-13 DIAGNOSIS — C711 Malignant neoplasm of frontal lobe: Secondary | ICD-10-CM | POA: Diagnosis not present

## 2021-08-13 DIAGNOSIS — R5383 Other fatigue: Secondary | ICD-10-CM | POA: Diagnosis not present

## 2021-08-13 DIAGNOSIS — C712 Malignant neoplasm of temporal lobe: Secondary | ICD-10-CM | POA: Diagnosis not present

## 2021-08-13 DIAGNOSIS — Z79899 Other long term (current) drug therapy: Secondary | ICD-10-CM | POA: Diagnosis not present

## 2021-08-13 DIAGNOSIS — G43909 Migraine, unspecified, not intractable, without status migrainosus: Secondary | ICD-10-CM | POA: Diagnosis not present

## 2021-08-16 ENCOUNTER — Ambulatory Visit
Admission: RE | Admit: 2021-08-16 | Discharge: 2021-08-16 | Disposition: A | Discharge status: Home / Self Care | Payer: BC Managed Care – PPO | Source: Ambulatory Visit | Attending: Radiation Oncology | Admitting: Radiation Oncology

## 2021-08-16 ENCOUNTER — Other Ambulatory Visit: Payer: Self-pay

## 2021-08-16 DIAGNOSIS — G43909 Migraine, unspecified, not intractable, without status migrainosus: Secondary | ICD-10-CM | POA: Diagnosis not present

## 2021-08-16 DIAGNOSIS — C711 Malignant neoplasm of frontal lobe: Secondary | ICD-10-CM | POA: Diagnosis not present

## 2021-08-16 DIAGNOSIS — Z510 Encounter for antineoplastic radiation therapy: Secondary | ICD-10-CM | POA: Diagnosis not present

## 2021-08-16 DIAGNOSIS — R5383 Other fatigue: Secondary | ICD-10-CM | POA: Diagnosis not present

## 2021-08-16 DIAGNOSIS — Z79899 Other long term (current) drug therapy: Secondary | ICD-10-CM | POA: Diagnosis not present

## 2021-08-16 DIAGNOSIS — C712 Malignant neoplasm of temporal lobe: Secondary | ICD-10-CM | POA: Diagnosis not present

## 2021-08-17 ENCOUNTER — Ambulatory Visit
Admission: RE | Admit: 2021-08-17 | Discharge: 2021-08-17 | Disposition: A | Discharge status: Home / Self Care | Payer: BC Managed Care – PPO | Source: Ambulatory Visit | Attending: Radiation Oncology | Admitting: Radiation Oncology

## 2021-08-17 ENCOUNTER — Inpatient Hospital Stay (HOSPITAL_BASED_OUTPATIENT_CLINIC_OR_DEPARTMENT_OTHER): Payer: BC Managed Care – PPO | Admitting: Internal Medicine

## 2021-08-17 ENCOUNTER — Inpatient Hospital Stay: Payer: BC Managed Care – PPO

## 2021-08-17 VITALS — BP 122/80 | HR 85 | Temp 98.1°F | Resp 18 | Wt 193.6 lb

## 2021-08-17 DIAGNOSIS — C719 Malignant neoplasm of brain, unspecified: Secondary | ICD-10-CM | POA: Diagnosis not present

## 2021-08-17 DIAGNOSIS — Z510 Encounter for antineoplastic radiation therapy: Secondary | ICD-10-CM | POA: Diagnosis not present

## 2021-08-17 DIAGNOSIS — R569 Unspecified convulsions: Secondary | ICD-10-CM

## 2021-08-17 DIAGNOSIS — R5383 Other fatigue: Secondary | ICD-10-CM | POA: Diagnosis not present

## 2021-08-17 DIAGNOSIS — Z79899 Other long term (current) drug therapy: Secondary | ICD-10-CM | POA: Diagnosis not present

## 2021-08-17 DIAGNOSIS — C712 Malignant neoplasm of temporal lobe: Secondary | ICD-10-CM | POA: Diagnosis not present

## 2021-08-17 DIAGNOSIS — G43909 Migraine, unspecified, not intractable, without status migrainosus: Secondary | ICD-10-CM | POA: Diagnosis not present

## 2021-08-17 DIAGNOSIS — C711 Malignant neoplasm of frontal lobe: Secondary | ICD-10-CM | POA: Diagnosis not present

## 2021-08-17 LAB — CBC WITH DIFFERENTIAL (CANCER CENTER ONLY)
Abs Immature Granulocytes: 0.02 10*3/uL (ref 0.00–0.07)
Basophils Absolute: 0 10*3/uL (ref 0.0–0.1)
Basophils Relative: 1 %
Eosinophils Absolute: 0.2 10*3/uL (ref 0.0–0.5)
Eosinophils Relative: 3 %
HCT: 40 % (ref 36.0–46.0)
Hemoglobin: 13.9 g/dL (ref 12.0–15.0)
Immature Granulocytes: 0 %
Lymphocytes Relative: 17 %
Lymphs Abs: 1 10*3/uL (ref 0.7–4.0)
MCH: 30.4 pg (ref 26.0–34.0)
MCHC: 34.8 g/dL (ref 30.0–36.0)
MCV: 87.5 fL (ref 80.0–100.0)
Monocytes Absolute: 0.6 10*3/uL (ref 0.1–1.0)
Monocytes Relative: 10 %
Neutro Abs: 4 10*3/uL (ref 1.7–7.7)
Neutrophils Relative %: 69 %
Platelet Count: 263 10*3/uL (ref 150–400)
RBC: 4.57 MIL/uL (ref 3.87–5.11)
RDW: 11.5 % (ref 11.5–15.5)
WBC Count: 5.8 10*3/uL (ref 4.0–10.5)
nRBC: 0 % (ref 0.0–0.2)

## 2021-08-17 LAB — CMP (CANCER CENTER ONLY)
ALT: 35 U/L (ref 0–44)
AST: 23 U/L (ref 15–41)
Albumin: 4.5 g/dL (ref 3.5–5.0)
Alkaline Phosphatase: 64 U/L (ref 38–126)
Anion gap: 10 (ref 5–15)
BUN: 12 mg/dL (ref 6–20)
CO2: 25 mmol/L (ref 22–32)
Calcium: 9.8 mg/dL (ref 8.9–10.3)
Chloride: 105 mmol/L (ref 98–111)
Creatinine: 0.84 mg/dL (ref 0.44–1.00)
GFR, Estimated: >60 mL/min (ref >60)
Glucose, Bld: 77 mg/dL (ref 70–99)
Potassium: 4.3 mmol/L (ref 3.5–5.1)
Sodium: 140 mmol/L (ref 135–145)
Total Bilirubin: 0.6 mg/dL (ref 0.3–1.2)
Total Protein: 7.7 g/dL (ref 6.5–8.1)

## 2021-08-17 NOTE — Progress Notes (Signed)
[facility] at [place], [place] [address] [phone]   Interval Evaluation  Date of Service: 08/17/21 Patient Name: Stacy Gutierrez Patient MRN: [id number] Patient DOB: 02/08/1991 Provider: Ventura Sellers, MD  Identifying Statement:  Stacy Gutierrez is a 30 y.o. 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 Tommi Rumps at [facility].  Path demonstrates WHO 2 diffuse astrocytoma, IDH mt   06/12/2021 Progression   Progression of disease #1   07/09/2021 Procedure   Oocyte retrieval at [facility], prior to initiation of chemotherapy   07/21/2021 -  Radiation Therapy   IMRT and concurrent Temodar with Dr. Isidore Moos    Biomarkers:  MGMT Unknown.  IDH 1/2 Mutated.  EGFR Unknown  TERT Unmutated   Interval History: Stacy Gutierrez presents today for follow up, now having completed 4 weeks of IMRT and Temozolomide.  She remain active, engaged despite fatigue at times.  Continues to lose some hair.   No new or progressive neurologic deficits.  Denies further seizures, headaches.    H+P (06/22/21) Patient presents today as referral from Dr. Ferdinand Lango at [facility], for local radiation and chemotherapy for progressive glioma.  Patient describes breakthrough seizure episode earlier this month, leading to [place] [facility] [place] [facility].  Vimpat was poorly tolerated, so Lamictal titration was initiated.  She is currently up to 24m BID, with plans to increase to 1055mBID. Also on Keppra 1500/2000.  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  [facility] team is recommending radiation and Temodar given recent progression noted on MRI.  No exposure to RT or chemo since diagnosis in 2019.  Medications: Current Outpatient Medications on File  Prior to Visit  Medication Sig Dispense Refill   lamoTRIgine (LAMICTAL) 100 MG tablet Take 1 tablet (100 mg total) by mouth daily. (Patient taking differently: Take 100 mg by mouth 2 (two) times daily.) 60 tablet 2   levETIRAcetam (KEPPRA) 1000 MG tablet 150040my mouth twice daily (Patient not taking: No sig reported)     levETIRAcetam (KEPPRA) 1000 MG tablet Take 1 tablet by mouth See admin instructions. 1500 mg by mouth every morning and 2000 mg at bedtime     levonorgestrel (MIRENA, 52 MG,) 20 MCG/DAY IUD 1 each by Intrauterine route once for 1 dose. 1 each 0   LORazepam (ATIVAN) 1 MG tablet Take 1 mg by mouth daily as needed.     ondansetron (ZOFRAN) 8 MG tablet Take 1 tablet (8 mg total) by mouth 2 (two) times daily as needed (nausea and vomiting). May take 30-60 minutes prior to Temodar administration if nausea/vomiting occurs. 30 tablet 1   temozolomide (TEMODAR) 140 MG capsule Take 1 capsule (140 mg total) by mouth daily. May take on an empty stomach to decrease nausea & vomiting. 42 capsule 0   No current facility-administered medications on file prior to visit.    Allergies: No Known Allergies Past Medical History:  Past Medical History:  Diagnosis Date   Astrocytoma ([facility]  Mononucleosis 01/22/2010   Seizures ([facility]  Vasovagal syncope    Past Surgical History:  Past Surgical History:  Procedure Laterality Date   CRANIOTOMY Left    TIBIA FRACTURE SURGERY  03/24/2010  Tibial Fracture-left leg--plate/screw--Dr. Linton Rump   TONSILLECTOMY     Social History:  Social History   Socioeconomic History   Marital status: Married    Spouse name: Not on file   Number of children: 0   Years of education: Not on file   Highest education level: Not on file  Occupational History   Occupation: Student  Tobacco Use   Smoking status: Never   Smokeless tobacco: Never  Vaping Use   Vaping Use: Never used  Substance and Sexual Activity   Alcohol use: No   Drug use: Not on file    Sexual activity: Yes    Partners: Male    Birth control/protection: I.U.D.  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   Social Determinants of Health   Financial Resource Strain: Not on file  Food Insecurity: Not on file  Transportation Needs: Not on file  Physical Activity: Not on file  Stress: Not on file  Social Connections: Not on file  Intimate Partner Violence: Not on file   Family History:  Family History  Problem Relation Age of Onset   Bladder Cancer Mother    Diabetes type II Maternal Grandmother        "heart issues"   Peripheral vascular disease Maternal Grandmother    Coronary artery disease Other    Diabetes Other    Hyperlipidemia Other    Stroke Other     Review of Systems: Constitutional: Doesn't report fevers, chills or abnormal weight loss Eyes: Doesn't report blurriness of vision Ears, nose, mouth, throat, and face: Doesn't report sore throat Respiratory: Doesn't report cough, dyspnea or wheezes Cardiovascular: Doesn't report palpitation, chest discomfort  Gastrointestinal:  Doesn't report nausea, constipation, diarrhea GU: Doesn't report incontinence Skin: Doesn't report skin rashes Neurological: Per HPI Musculoskeletal: Doesn't report joint pain Behavioral/Psych: Doesn't report anxiety  Physical Exam: Vitals:   08/17/21 0954  BP: 122/80  Pulse: 85  Resp: 18  Temp: 98.1 F (36.7 C)  SpO2: 97%     KPS: 90. General: Alert, cooperative, pleasant, in no acute distress Head: Normal EENT: No conjunctival injection or scleral icterus.  Lungs: Resp effort normal Cardiac: Regular rate Abdomen: Non-distended abdomen Skin: No rashes cyanosis or petechiae. Extremities: No clubbing or edema  Neurologic Exam: Mental Status: Awake, alert, attentive to examiner. Oriented to self and environment. Language is fluent with intact comprehension.   Cranial Nerves: Visual acuity is grossly normal. Visual fields are full. Extra-ocular movements intact. No ptosis. Face is symmetric Motor: Tone and bulk are normal. Power is full in both arms and legs. Reflexes are symmetric, no pathologic reflexes present.  Sensory: Intact to light touch Gait: Normal.   Labs: I have reviewed the data as listed    Component Value Date/Time   NA 140 08/03/2021 0915   K 4.1 08/03/2021 0915   CL 103 08/03/2021 0915   CO2 28 08/03/2021 0915   GLUCOSE 107 (H) 08/03/2021 0915   BUN 15 08/03/2021 0915   CREATININE 0.96 08/03/2021 0915   CALCIUM 10.0 08/03/2021 0915   PROT 7.5 08/03/2021 0915   ALBUMIN 4.5 08/03/2021 0915   AST 22 08/03/2021 0915   ALT 36 08/03/2021 0915   ALKPHOS 61 08/03/2021 0915   BILITOT 0.6 08/03/2021 0915   GFRNONAA >60 08/03/2021 0915   Lab Results  Component Value Date   WBC 5.8 08/17/2021  NEUTROABS 4.0 08/17/2021   HGB 13.9 08/17/2021   HCT 40.0 08/17/2021   MCV 87.5 08/17/2021   PLT 263 08/17/2021    Imaging: 06/12/21    Assessment/Plan Seizures ([place])  Astrocytoma ([place])  Stacy Gutierrez is clinically stable today, now having completed #4 weeks of IMRT and Temozolomide.  No new or progressive changes.  Labs are within normal limits.  We ultimately recommended continuing with course of intensity modulated radiation therapy and concurrent daily Temozolomide.  Radiation will be administered Mon-Fri over 6 weeks, Temodar will be dosed at 42m/m2 to be given daily over 42 days.  We reviewed side effects of temodar, including fatigue, nausea/vomiting, constipation, and cytopenias.  Chemotherapy should be held for the following:  ANC less than 1,000  Platelets less than 100,000  LFT or creatinine greater than 2x ULN  If clinical concerns/contraindications develop  Every 2 weeks during radiation, labs will be checked accompanied by a clinical evaluation in the brain tumor clinic.  For seizures, she will  continue on Keppra and Lamictal titration as ordered.  May dose sumatriptan, NSAIDs or Tylenol for breakthrough migraines.  All questions were answered. The patient knows to call the clinic with any problems, questions or concerns. No barriers to learning were detected.  The total time spent in the encounter was 30 minutes and more than 50% was on counseling and review of test results   ZVentura Sellers MD Medical Director of Neuro-Oncology [facility] WH[place]10/25/22 9:56 AM

## 2021-08-18 ENCOUNTER — Ambulatory Visit
Admission: RE | Admit: 2021-08-18 | Discharge: 2021-08-18 | Disposition: A | Discharge status: Home / Self Care | Payer: BC Managed Care – PPO | Source: Ambulatory Visit | Attending: Radiation Oncology | Admitting: Radiation Oncology

## 2021-08-18 ENCOUNTER — Other Ambulatory Visit: Payer: Self-pay

## 2021-08-18 ENCOUNTER — Ambulatory Visit: Payer: BC Managed Care – PPO

## 2021-08-18 DIAGNOSIS — Z79899 Other long term (current) drug therapy: Secondary | ICD-10-CM | POA: Diagnosis not present

## 2021-08-18 DIAGNOSIS — C711 Malignant neoplasm of frontal lobe: Secondary | ICD-10-CM | POA: Diagnosis not present

## 2021-08-18 DIAGNOSIS — R5383 Other fatigue: Secondary | ICD-10-CM | POA: Diagnosis not present

## 2021-08-18 DIAGNOSIS — Z510 Encounter for antineoplastic radiation therapy: Secondary | ICD-10-CM | POA: Diagnosis not present

## 2021-08-18 DIAGNOSIS — C712 Malignant neoplasm of temporal lobe: Secondary | ICD-10-CM | POA: Diagnosis not present

## 2021-08-18 DIAGNOSIS — G43909 Migraine, unspecified, not intractable, without status migrainosus: Secondary | ICD-10-CM | POA: Diagnosis not present

## 2021-08-19 ENCOUNTER — Ambulatory Visit
Admission: RE | Admit: 2021-08-19 | Discharge: 2021-08-19 | Disposition: A | Discharge status: Home / Self Care | Payer: BC Managed Care – PPO | Source: Ambulatory Visit | Attending: Radiation Oncology | Admitting: Radiation Oncology

## 2021-08-19 DIAGNOSIS — G43909 Migraine, unspecified, not intractable, without status migrainosus: Secondary | ICD-10-CM | POA: Diagnosis not present

## 2021-08-19 DIAGNOSIS — C711 Malignant neoplasm of frontal lobe: Secondary | ICD-10-CM | POA: Diagnosis not present

## 2021-08-19 DIAGNOSIS — Z510 Encounter for antineoplastic radiation therapy: Secondary | ICD-10-CM | POA: Diagnosis not present

## 2021-08-19 DIAGNOSIS — Z79899 Other long term (current) drug therapy: Secondary | ICD-10-CM | POA: Diagnosis not present

## 2021-08-19 DIAGNOSIS — R5383 Other fatigue: Secondary | ICD-10-CM | POA: Diagnosis not present

## 2021-08-19 DIAGNOSIS — C712 Malignant neoplasm of temporal lobe: Secondary | ICD-10-CM | POA: Diagnosis not present

## 2021-08-20 ENCOUNTER — Other Ambulatory Visit: Payer: Self-pay

## 2021-08-20 ENCOUNTER — Ambulatory Visit
Admission: RE | Admit: 2021-08-20 | Discharge: 2021-08-20 | Disposition: A | Discharge status: Home / Self Care | Payer: BC Managed Care – PPO | Source: Ambulatory Visit | Attending: Radiation Oncology | Admitting: Radiation Oncology

## 2021-08-20 DIAGNOSIS — Z79899 Other long term (current) drug therapy: Secondary | ICD-10-CM | POA: Diagnosis not present

## 2021-08-20 DIAGNOSIS — C711 Malignant neoplasm of frontal lobe: Secondary | ICD-10-CM | POA: Diagnosis not present

## 2021-08-20 DIAGNOSIS — G43909 Migraine, unspecified, not intractable, without status migrainosus: Secondary | ICD-10-CM | POA: Diagnosis not present

## 2021-08-20 DIAGNOSIS — R5383 Other fatigue: Secondary | ICD-10-CM | POA: Diagnosis not present

## 2021-08-20 DIAGNOSIS — C712 Malignant neoplasm of temporal lobe: Secondary | ICD-10-CM | POA: Diagnosis not present

## 2021-08-20 DIAGNOSIS — Z510 Encounter for antineoplastic radiation therapy: Secondary | ICD-10-CM | POA: Diagnosis not present

## 2021-08-23 ENCOUNTER — Other Ambulatory Visit: Payer: Self-pay

## 2021-08-23 ENCOUNTER — Ambulatory Visit
Admission: RE | Admit: 2021-08-23 | Discharge: 2021-08-23 | Disposition: A | Discharge status: Home / Self Care | Payer: BC Managed Care – PPO | Source: Ambulatory Visit | Attending: Radiation Oncology | Admitting: Radiation Oncology

## 2021-08-23 DIAGNOSIS — R5383 Other fatigue: Secondary | ICD-10-CM | POA: Diagnosis not present

## 2021-08-23 DIAGNOSIS — G43909 Migraine, unspecified, not intractable, without status migrainosus: Secondary | ICD-10-CM | POA: Diagnosis not present

## 2021-08-23 DIAGNOSIS — C712 Malignant neoplasm of temporal lobe: Secondary | ICD-10-CM | POA: Diagnosis not present

## 2021-08-23 DIAGNOSIS — Z510 Encounter for antineoplastic radiation therapy: Secondary | ICD-10-CM | POA: Diagnosis not present

## 2021-08-23 DIAGNOSIS — Z79899 Other long term (current) drug therapy: Secondary | ICD-10-CM | POA: Diagnosis not present

## 2021-08-23 DIAGNOSIS — C711 Malignant neoplasm of frontal lobe: Secondary | ICD-10-CM | POA: Diagnosis not present

## 2021-08-24 ENCOUNTER — Ambulatory Visit
Admission: RE | Admit: 2021-08-24 | Discharge: 2021-08-24 | Disposition: A | Discharge status: Home / Self Care | Payer: BC Managed Care – PPO | Source: Ambulatory Visit | Attending: Radiation Oncology | Admitting: Radiation Oncology

## 2021-08-24 DIAGNOSIS — C711 Malignant neoplasm of frontal lobe: Secondary | ICD-10-CM | POA: Insufficient documentation

## 2021-08-24 DIAGNOSIS — G43909 Migraine, unspecified, not intractable, without status migrainosus: Secondary | ICD-10-CM | POA: Diagnosis not present

## 2021-08-24 DIAGNOSIS — Z510 Encounter for antineoplastic radiation therapy: Secondary | ICD-10-CM | POA: Insufficient documentation

## 2021-08-24 DIAGNOSIS — Z79630 Long term (current) use of alkylating agent: Secondary | ICD-10-CM | POA: Diagnosis not present

## 2021-08-25 ENCOUNTER — Ambulatory Visit: Payer: BC Managed Care – PPO | Admitting: Registered Nurse

## 2021-08-25 ENCOUNTER — Encounter: Payer: Self-pay | Admitting: General Practice

## 2021-08-25 ENCOUNTER — Ambulatory Visit
Admission: RE | Admit: 2021-08-25 | Discharge: 2021-08-25 | Disposition: A | Discharge status: Home / Self Care | Payer: BC Managed Care – PPO | Source: Ambulatory Visit | Attending: Radiation Oncology | Admitting: Radiation Oncology

## 2021-08-25 ENCOUNTER — Telehealth: Payer: Self-pay | Admitting: *Deleted

## 2021-08-25 ENCOUNTER — Other Ambulatory Visit: Payer: Self-pay | Admitting: Radiation Oncology

## 2021-08-25 ENCOUNTER — Other Ambulatory Visit: Payer: Self-pay

## 2021-08-25 DIAGNOSIS — H6982 Other specified disorders of Eustachian tube, left ear: Secondary | ICD-10-CM | POA: Diagnosis not present

## 2021-08-25 DIAGNOSIS — Z510 Encounter for antineoplastic radiation therapy: Secondary | ICD-10-CM | POA: Diagnosis not present

## 2021-08-25 DIAGNOSIS — Z79630 Long term (current) use of alkylating agent: Secondary | ICD-10-CM | POA: Diagnosis not present

## 2021-08-25 DIAGNOSIS — C711 Malignant neoplasm of frontal lobe: Secondary | ICD-10-CM | POA: Diagnosis not present

## 2021-08-25 DIAGNOSIS — H9012 Conductive hearing loss, unilateral, left ear, with unrestricted hearing on the contralateral side: Secondary | ICD-10-CM | POA: Diagnosis not present

## 2021-08-25 DIAGNOSIS — G43909 Migraine, unspecified, not intractable, without status migrainosus: Secondary | ICD-10-CM | POA: Diagnosis not present

## 2021-08-25 MED ORDER — PSEUDOEPHEDRINE HCL 30 MG PO TABS: 30.0000 mg | ORAL_TABLET | Freq: Three times a day (TID) | ORAL | 0 refills | Status: DC

## 2021-08-25 NOTE — Telephone Encounter (Signed)
CALLED PATIENT TO INFORM OF Stacy Gutierrez VISIT WITH DR. BATES ON 08-25-21- ARRIVAL TIME- 2 PM, SPOKE WITH PATIENT AND SHE IS AWARE OF THIS APPT.

## 2021-08-25 NOTE — Progress Notes (Signed)
[facility] Spiritual Care Note  Referred by Willodean Rosenthal for emotional support, particularly for Dayami' mom Kennyth Lose. Met Ubaldo Glassing and Kennyth Lose in radiation to introduce Spiritual Care and [place] support programming, bringing caregiver support information. Will follow Kennyth Lose for emotional support and remain available to [place] as needed/desired.   [place], [place], [facility] Pager [phone] Voicemail [phone]

## 2021-08-25 NOTE — Telephone Encounter (Signed)
CALLED PATIENT TO INFORM OF APPT. WITH ENT DR. Jinny Blossom SCOTT Kotlik ON 11-8-222 1 PM, ADDRESS [address], PHONE NUMBER - [phone], LVM FOR A RETURN CALL

## 2021-08-26 ENCOUNTER — Ambulatory Visit
Admission: RE | Admit: 2021-08-26 | Discharge: 2021-08-26 | Disposition: A | Discharge status: Home / Self Care | Payer: BC Managed Care – PPO | Source: Ambulatory Visit | Attending: Radiation Oncology | Admitting: Radiation Oncology

## 2021-08-26 ENCOUNTER — Telehealth: Payer: Self-pay

## 2021-08-26 DIAGNOSIS — G43909 Migraine, unspecified, not intractable, without status migrainosus: Secondary | ICD-10-CM | POA: Diagnosis not present

## 2021-08-26 DIAGNOSIS — C711 Malignant neoplasm of frontal lobe: Secondary | ICD-10-CM | POA: Diagnosis not present

## 2021-08-26 DIAGNOSIS — Z510 Encounter for antineoplastic radiation therapy: Secondary | ICD-10-CM | POA: Diagnosis not present

## 2021-08-26 DIAGNOSIS — H6982 Other specified disorders of Eustachian tube, left ear: Secondary | ICD-10-CM | POA: Diagnosis not present

## 2021-08-26 DIAGNOSIS — Z79630 Long term (current) use of alkylating agent: Secondary | ICD-10-CM | POA: Diagnosis not present

## 2021-08-26 HISTORY — PX: TYMPANOSTOMY TUBE PLACEMENT: SHX32

## 2021-08-26 NOTE — Telephone Encounter (Signed)
Received VM this morning from patient's mother Stacy Gutierrez. She stated she and patient had met with Dr. Johnnette Gourd yesterday to address patient's intense left sided inner ear pain. Per Stacy Gutierrez, Dr. Redmond Baseman recommended placing a eustachian tube to help drain fluid build up, and relieve pressure/swelling. He informed them that a decongestant would not address the issue or provide discernable relief. 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 Stacy Gutierrez requested either a return call from Dr. Isidore Moos or to meet with Dr. Isidore Moos in clinic today after patient's radiation appointment to discuss matter.   Returned [name]'s call to let her know that Dr. Isidore Moos is not in the office on Thursdays, but I had been able to get in touch with her to relay [name]'s concerns. Per Dr. Isidore Moos: Dr. Redmond Baseman is an excellent clinician and she trusts his expertise/recommendations. Ultimately though, it is a personal decision and the patient must decide herself if the immediate relief is worth the possible long-term effects later down the road. Stacy Gutierrez verbalized understanding and agreement, and stated that she'll support whatever decision Stacy Gutierrez makes. Stacy Gutierrez verbalized appreciation of call, and denied any other needs at this time. She has my direct call back number should she or Stacy Gutierrez have any other questions/concerns.

## 2021-08-27 ENCOUNTER — Ambulatory Visit
Admission: RE | Admit: 2021-08-27 | Discharge: 2021-08-27 | Disposition: A | Discharge status: Home / Self Care | Payer: BC Managed Care – PPO | Source: Ambulatory Visit | Attending: Radiation Oncology | Admitting: Radiation Oncology

## 2021-08-27 ENCOUNTER — Other Ambulatory Visit: Payer: Self-pay

## 2021-08-27 DIAGNOSIS — G43909 Migraine, unspecified, not intractable, without status migrainosus: Secondary | ICD-10-CM | POA: Diagnosis not present

## 2021-08-27 DIAGNOSIS — Z79630 Long term (current) use of alkylating agent: Secondary | ICD-10-CM | POA: Diagnosis not present

## 2021-08-27 DIAGNOSIS — Z510 Encounter for antineoplastic radiation therapy: Secondary | ICD-10-CM | POA: Diagnosis not present

## 2021-08-27 DIAGNOSIS — C711 Malignant neoplasm of frontal lobe: Secondary | ICD-10-CM | POA: Diagnosis not present

## 2021-08-30 ENCOUNTER — Ambulatory Visit
Admission: RE | Admit: 2021-08-30 | Discharge: 2021-08-30 | Disposition: A | Discharge status: Home / Self Care | Payer: BC Managed Care – PPO | Source: Ambulatory Visit | Attending: Radiation Oncology | Admitting: Radiation Oncology

## 2021-08-30 ENCOUNTER — Other Ambulatory Visit: Payer: Self-pay

## 2021-08-30 DIAGNOSIS — G43909 Migraine, unspecified, not intractable, without status migrainosus: Secondary | ICD-10-CM | POA: Diagnosis not present

## 2021-08-30 DIAGNOSIS — Z79630 Long term (current) use of alkylating agent: Secondary | ICD-10-CM | POA: Diagnosis not present

## 2021-08-30 DIAGNOSIS — Z510 Encounter for antineoplastic radiation therapy: Secondary | ICD-10-CM | POA: Diagnosis not present

## 2021-08-30 DIAGNOSIS — C711 Malignant neoplasm of frontal lobe: Secondary | ICD-10-CM | POA: Diagnosis not present

## 2021-08-31 ENCOUNTER — Encounter: Payer: Self-pay | Admitting: Internal Medicine

## 2021-08-31 ENCOUNTER — Ambulatory Visit
Admission: RE | Admit: 2021-08-31 | Discharge: 2021-08-31 | Disposition: A | Discharge status: Home / Self Care | Payer: BC Managed Care – PPO | Source: Ambulatory Visit | Attending: Radiation Oncology | Admitting: Radiation Oncology

## 2021-08-31 ENCOUNTER — Inpatient Hospital Stay (HOSPITAL_BASED_OUTPATIENT_CLINIC_OR_DEPARTMENT_OTHER): Payer: BC Managed Care – PPO | Admitting: Internal Medicine

## 2021-08-31 ENCOUNTER — Inpatient Hospital Stay: Payer: BC Managed Care – PPO | Attending: Internal Medicine

## 2021-08-31 ENCOUNTER — Ambulatory Visit: Payer: BC Managed Care – PPO

## 2021-08-31 ENCOUNTER — Encounter: Payer: Self-pay | Admitting: Radiation Oncology

## 2021-08-31 VITALS — BP 127/85 | HR 88 | Temp 98.1°F | Resp 20 | Wt 194.3 lb

## 2021-08-31 DIAGNOSIS — C719 Malignant neoplasm of brain, unspecified: Secondary | ICD-10-CM

## 2021-08-31 DIAGNOSIS — R569 Unspecified convulsions: Secondary | ICD-10-CM | POA: Diagnosis not present

## 2021-08-31 DIAGNOSIS — Z79630 Long term (current) use of alkylating agent: Secondary | ICD-10-CM | POA: Diagnosis not present

## 2021-08-31 DIAGNOSIS — G43909 Migraine, unspecified, not intractable, without status migrainosus: Secondary | ICD-10-CM | POA: Insufficient documentation

## 2021-08-31 DIAGNOSIS — C711 Malignant neoplasm of frontal lobe: Secondary | ICD-10-CM | POA: Diagnosis not present

## 2021-08-31 DIAGNOSIS — Z510 Encounter for antineoplastic radiation therapy: Secondary | ICD-10-CM | POA: Insufficient documentation

## 2021-08-31 LAB — CBC WITH DIFFERENTIAL (CANCER CENTER ONLY)
Abs Immature Granulocytes: 0.01 10*3/uL (ref 0.00–0.07)
Basophils Absolute: 0 10*3/uL (ref 0.0–0.1)
Basophils Relative: 1 %
Eosinophils Absolute: 0.4 10*3/uL (ref 0.0–0.5)
Eosinophils Relative: 7 %
HCT: 40.5 % (ref 36.0–46.0)
Hemoglobin: 13.8 g/dL (ref 12.0–15.0)
Immature Granulocytes: 0 %
Lymphocytes Relative: 11 %
Lymphs Abs: 0.6 10*3/uL — ABNORMAL LOW (ref 0.7–4.0)
MCH: 30.3 pg (ref 26.0–34.0)
MCHC: 34.1 g/dL (ref 30.0–36.0)
MCV: 89 fL (ref 80.0–100.0)
Monocytes Absolute: 0.5 10*3/uL (ref 0.1–1.0)
Monocytes Relative: 8 %
Neutro Abs: 4.3 10*3/uL (ref 1.7–7.7)
Neutrophils Relative %: 73 %
Platelet Count: 238 10*3/uL (ref 150–400)
RBC: 4.55 MIL/uL (ref 3.87–5.11)
RDW: 11.4 % — ABNORMAL LOW (ref 11.5–15.5)
WBC Count: 5.8 10*3/uL (ref 4.0–10.5)
nRBC: 0 % (ref 0.0–0.2)

## 2021-08-31 LAB — CMP (CANCER CENTER ONLY)
ALT: 37 U/L (ref 0–44)
AST: 24 U/L (ref 15–41)
Albumin: 4.5 g/dL (ref 3.5–5.0)
Alkaline Phosphatase: 64 U/L (ref 38–126)
Anion gap: 7 (ref 5–15)
BUN: 15 mg/dL (ref 6–20)
CO2: 28 mmol/L (ref 22–32)
Calcium: 9.4 mg/dL (ref 8.9–10.3)
Chloride: 105 mmol/L (ref 98–111)
Creatinine: 0.84 mg/dL (ref 0.44–1.00)
GFR, Estimated: >60 mL/min (ref >60)
Glucose, Bld: 79 mg/dL (ref 70–99)
Potassium: 4 mmol/L (ref 3.5–5.1)
Sodium: 140 mmol/L (ref 135–145)
Total Bilirubin: 0.5 mg/dL (ref 0.3–1.2)
Total Protein: 7.5 g/dL (ref 6.5–8.1)

## 2021-08-31 NOTE — Progress Notes (Signed)
[place] at [place],  [id number] [phone]   Interval Evaluation  Date of Service: 08/31/21 Patient Name: Stacy Gutierrez Patient MRN: [id number] Patient DOB: [birth year] Provider: Ventura Sellers, MD  Identifying Statement:  Stacy Gutierrez is a 30 y.o. 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 Tommi Rumps at [facility].  Path demonstrates WHO 2 diffuse astrocytoma, IDH mt   06/12/2021 Progression   Progression of disease #1   07/09/2021 Procedure   Oocyte retrieval at [place], prior to initiation of chemotherapy   07/21/2021 -  Radiation Therapy   IMRT and concurrent Temodar with Dr. Isidore Moos    Biomarkers:  MGMT Unknown.  IDH 1/2 Mutated.  EGFR Unknown  TERT Unmutated   Interval History: Stacy Gutierrez presents today for follow up, now having completed her 6 weeks of IMRT and Temozolomide.  Did develop fullness and hearing impairment in her left ear last week, she was referred to ENT and had tube placed.  Otherwise remains active, engaged despite fatigue at times.  Continues to lose some hair, now half of head is shaven.   Aside from ear no new or progressive neurologic deficits.  Denies further seizures, headaches.    H+P (06/22/21) Patient presents today as referral from Dr. Ferdinand Lango at [facility], for local radiation and chemotherapy for progressive glioma.  Patient describes breakthrough seizure episode earlier this month, leading [place] [facility].  Vimpat was poorly tolerated, so Lamictal titration was initiated.  She is currently up to 102m BID, with plans to increase to 1077mBID. Also on Keppra 1500/2000.  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  [facility] team is recommending  radiation and Temodar given recent progression noted on MRI.  No exposure to RT or chemo since diagnosis in 2019.  Medications: Current Outpatient Medications on File Prior to Visit  Medication Sig Dispense Refill   lamoTRIgine (LAMICTAL) 100 MG tablet Take 1 tablet (100 mg total) by mouth daily. (Patient taking differently: Take 100 mg by mouth 2 (two) times daily.) 60 tablet 2   levETIRAcetam (KEPPRA) 1000 MG tablet 150087my mouth twice daily (Patient not taking: No sig reported)     levETIRAcetam (KEPPRA) 1000 MG tablet Take 1 tablet by mouth See admin instructions. 1500 mg by mouth every morning and 2000 mg at bedtime     levonorgestrel (MIRENA, 52 MG,) 20 MCG/DAY IUD 1 each by Intrauterine route once for 1 dose. 1 each 0   LORazepam (ATIVAN) 1 MG tablet Take 1 mg by mouth daily as needed.     ondansetron (ZOFRAN) 8 MG tablet Take 1 tablet (8 mg total) by mouth 2 (two) times daily as needed (nausea and vomiting). May take 30-60 minutes prior to Temodar administration if nausea/vomiting occurs. 30 tablet 1   pseudoephedrine (SUDAFED) 30 MG tablet Take 1-2 tablets (30-60 mg total) by mouth 3 (three) times daily. (I.e. take QAM, middday, and afternoon, approx 6 hours apart). Start with 66m30mse. 60 tablet 0   temozolomide (TEMODAR) 140 MG capsule Take 1 capsule (140 mg total) by mouth daily. May take on an empty stomach to decrease nausea & vomiting. 42 capsule 0   No current facility-administered medications on  file prior to visit.    Allergies: No Known Allergies Past Medical History:  Past Medical History:  Diagnosis Date   Astrocytoma ([place])    Mononucleosis 01/22/2010   Seizures ([place])    Vasovagal syncope    Past Surgical History:  Past Surgical History:  Procedure Laterality Date   CRANIOTOMY Left    TIBIA FRACTURE SURGERY  03/24/2010   Tibial Fracture-left leg--plate/screw--Dr. Linton Rump   TONSILLECTOMY     Social History:  Social History   Socioeconomic History   Marital  status: Married    Spouse name: Not on file   Number of children: 0   Years of education: Not on file   Highest education level: Not on file  Occupational History   Occupation: Ship broker  Tobacco Use   Smoking status: Never   Smokeless tobacco: Never  Vaping Use   Vaping Use: Never used  Substance and Sexual Activity   Alcohol use: No   Drug use: Not on file   Sexual activity: Yes    Partners: Male    Birth control/protection: I.U.D.  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   Social Determinants of Health   Financial Resource Strain: Not on file  Food Insecurity: Not on file  Transportation Needs: Not on file  Physical Activity: Not on file  Stress: Not on file  Social Connections: Not on file  Intimate Partner Violence: Not on file   Family History:  Family History  Problem Relation Age of Onset   Bladder Cancer Mother    Diabetes type II Maternal Grandmother        "heart issues"   Peripheral vascular disease Maternal Grandmother    Coronary artery disease Other    Diabetes Other    Hyperlipidemia Other    Stroke Other     Review of Systems: Constitutional: Doesn't report fevers, chills or abnormal weight loss Eyes: Doesn't report blurriness of vision Ears, nose, mouth, throat, and face: Doesn't report sore throat Respiratory: Doesn't report cough, dyspnea or wheezes Cardiovascular: Doesn't report palpitation, chest discomfort  Gastrointestinal:  Doesn't report nausea, constipation, diarrhea GU: Doesn't report incontinence Skin: Doesn't report skin rashes Neurological: Per HPI Musculoskeletal: Doesn't report joint pain Behavioral/Psych: Doesn't report anxiety  Physical Exam: Vitals:   08/31/21 0935  BP: 127/85  Pulse: 88  Resp: 20  Temp: 98.1 F (36.7 C)  SpO2: 99%   KPS: 90. General: Alert, cooperative, pleasant, in no acute distress Head:  Normal EENT: No conjunctival injection or scleral icterus.  Lungs: Resp effort normal Cardiac: Regular rate Abdomen: Non-distended abdomen Skin: No rashes cyanosis or petechiae. Extremities: No clubbing or edema  Neurologic Exam: Mental Status: Awake, alert, attentive to examiner. Oriented to self and environment. Language is fluent with intact comprehension.  Cranial Nerves: Visual acuity is grossly normal. Visual fields are full. Extra-ocular movements intact. No ptosis. Face is symmetric Motor: Tone and bulk are normal. Power is full in both arms and legs. Reflexes are symmetric, no pathologic reflexes present.  Sensory: Intact to light touch Gait: Normal.   Labs: I have reviewed the data as listed    Component Value Date/Time   NA 140 08/17/2021 0940   K 4.3 08/17/2021 0940   CL 105 08/17/2021 0940   CO2 25 08/17/2021 0940   GLUCOSE 77 08/17/2021 0940   BUN 12 08/17/2021 0940   CREATININE 0.84 08/17/2021  0940   CALCIUM 9.8 08/17/2021 0940   PROT 7.7 08/17/2021 0940   ALBUMIN 4.5 08/17/2021 0940   AST 23 08/17/2021 0940   ALT 35 08/17/2021 0940   ALKPHOS 64 08/17/2021 0940   BILITOT 0.6 08/17/2021 0940   GFRNONAA >60 08/17/2021 0940   Lab Results  Component Value Date   WBC 5.8 08/31/2021   NEUTROABS 4.3 08/31/2021   HGB 13.8 08/31/2021   HCT 40.5 08/31/2021   MCV 89.0 08/31/2021   PLT 238 08/31/2021    Imaging: 06/12/21    Assessment/Plan Astrocytoma (HCC)  Seizures ([place])  JLYNN LY is clinically stable today, now in final week of IMRT and Temozolomide.  No new or progressive changes.  Labs are within normal limits.  We ultimately recommended completing course of intensity modulated radiation therapy and concurrent daily Temozolomide.  Radiation will be administered Mon-Fri over 6 weeks, Temodar will be dosed at 31m/m2 to be given daily over 42 days.  We reviewed side effects of temodar, including fatigue, nausea/vomiting, constipation, and  cytopenias.  Chemotherapy should be held for the following:  ANC less than 1,000  Platelets less than 100,000  LFT or creatinine greater than 2x ULN  If clinical concerns/contraindications develop  For seizures, she will continue on Keppra and Lamictal.  May dose sumatriptan, NSAIDs or Tylenol for breakthrough migraines.  [facility]  We are happy to remain involved in whatever capacity is needed.  All questions were answered. The patient knows to call the clinic with any problems, questions or concerns. No barriers to learning were detected.  The total time spent in the encounter was 30 minutes and more than 50% was on counseling and review of test results   ZVentura Sellers MD Medical Director of Neuro-Oncology [facility]11/08/22 9:32 AM

## 2021-09-01 ENCOUNTER — Ambulatory Visit: Payer: BC Managed Care – PPO

## 2021-09-04 ENCOUNTER — Encounter: Payer: Self-pay | Admitting: Internal Medicine

## 2021-09-20 ENCOUNTER — Other Ambulatory Visit: Payer: Self-pay | Admitting: *Deleted

## 2021-09-20 DIAGNOSIS — C719 Malignant neoplasm of brain, unspecified: Secondary | ICD-10-CM

## 2021-09-20 NOTE — Progress Notes (Signed)
Patient was unable to get MRI done at [facility].  She needs MRI ordered to be done locally and to be pushed via powershare to [place] prior to her visit on 10/05/2021.

## 2021-09-21 ENCOUNTER — Other Ambulatory Visit: Payer: Self-pay | Admitting: Radiation Therapy

## 2021-09-28 ENCOUNTER — Other Ambulatory Visit: Payer: Self-pay | Admitting: Radiation Therapy

## 2021-09-29 ENCOUNTER — Other Ambulatory Visit: Payer: Self-pay

## 2021-09-29 ENCOUNTER — Ambulatory Visit (HOSPITAL_COMMUNITY)
Admission: RE | Admit: 2021-09-29 | Discharge: 2021-09-29 | Disposition: A | Discharge status: Home / Self Care | Payer: BC Managed Care – PPO | Source: Ambulatory Visit | Attending: Internal Medicine | Admitting: Internal Medicine

## 2021-09-29 DIAGNOSIS — Z85841 Personal history of malignant neoplasm of brain: Secondary | ICD-10-CM | POA: Diagnosis not present

## 2021-09-29 DIAGNOSIS — R220 Localized swelling, mass and lump, head: Secondary | ICD-10-CM | POA: Diagnosis not present

## 2021-09-29 DIAGNOSIS — C719 Malignant neoplasm of brain, unspecified: Secondary | ICD-10-CM | POA: Diagnosis not present

## 2021-09-29 DIAGNOSIS — D496 Neoplasm of unspecified behavior of brain: Secondary | ICD-10-CM | POA: Diagnosis not present

## 2021-09-29 IMAGING — MR MR HEAD WO/W CM
13 series · 48 of 48 positions shown · IV contrast (gadavist)
Comparison: MRI head from [facility] [DATE], [DATE]

CLINICAL DATA: History of astrocytoma with craniotomy 3 years ago.
Assess treatment response.

EXAM:
MRI HEAD WITHOUT AND WITH CONTRAST
TECHNIQUE: Multiplanar, multiecho pulse sequences of the brain and surrounding
structures were obtained without and with intravenous contrast.
CONTRAST:  9mL GADAVIST GADOBUTROL 1 MMOL/ML IV SOLN

[Series 5: DWI · axial · 3.0mm · 1.36mm/px · z∈[-30,+128]mm · 7 of 108 slices shown (1 of 2)]
[im 1/108]
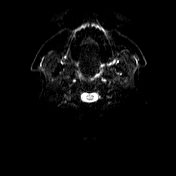
[im 18/108]
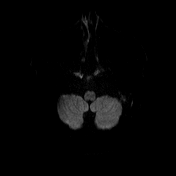
[im 36/108]
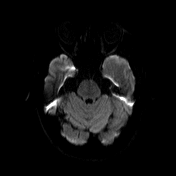
[im 54/108]
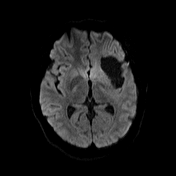
[im 72/108]
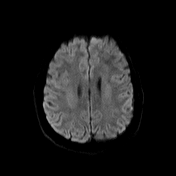
[im 90/108]
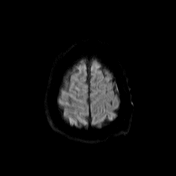
[im 108/108]
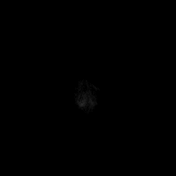

[Series 6: DWI · axial · 3.0mm · 1.36mm/px · z∈[-30,+128]mm · 3 of 54 slices shown (2 of 2)]
[im 1/54]
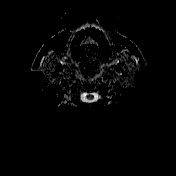
[im 27/54]
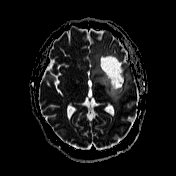
[im 54/54]
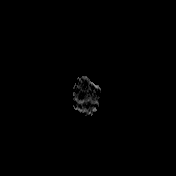

[Series 7: T1 · sagittal · 5.0mm · 0.75mm/px · 2 of 27 slices shown (1 of 2)]
[im 1/27]
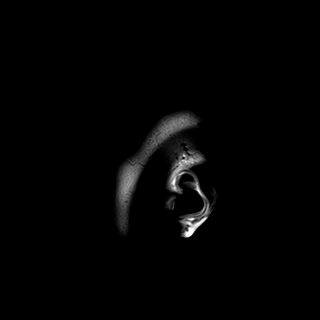
[im 27/27]
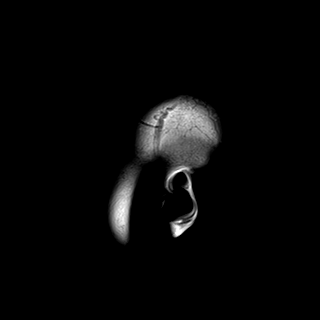

[Series 8: T2 · axial · 5.0mm · 0.62mm/px · 1 of 25 slices shown]
[im 1/25]
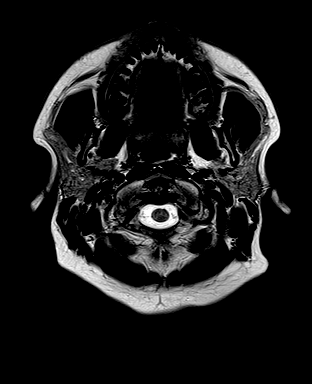

[Series 9: swi_images · axial · 3.0mm · 0.75mm/px · z∈[-33,+131]mm · 3 of 56 slices shown]
[im 1/56]
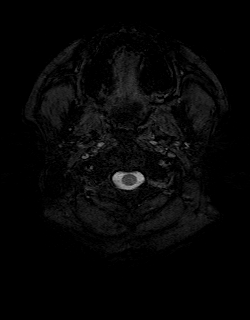
[im 28/56]
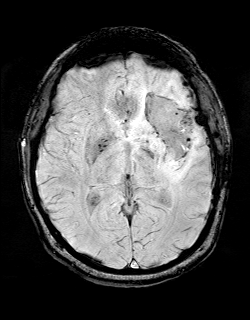
[im 56/56]
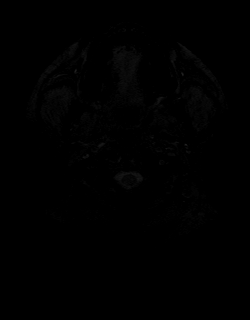

[Series 11: FLAIR · axial · 3.0mm · 0.75mm/px · z∈[-30,+128]mm · 3 of 54 slices shown]
[im 1/54]
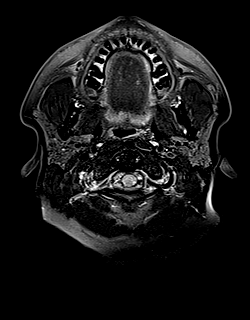
[im 27/54]
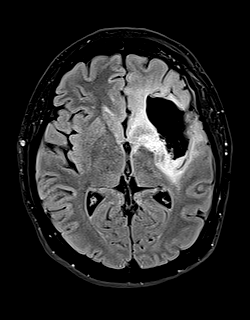
[im 54/54]
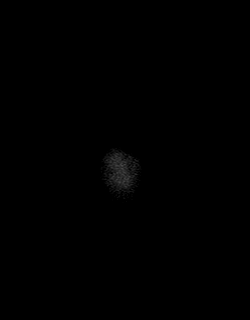

[Series 12: T1 · axial · 1.0mm · 0.94mm/px · z∈[-30,+128]mm · 9 of 160 slices shown (2 of 2)]
[im 1/160]
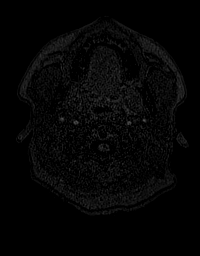
[im 20/160]
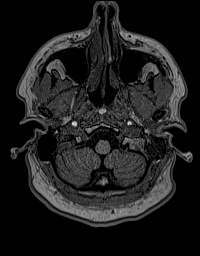
[im 40/160]
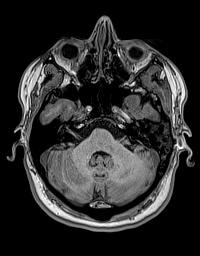
[im 60/160]
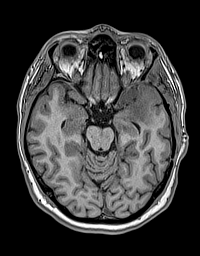
[im 80/160]
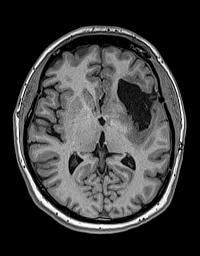
[im 100/160]
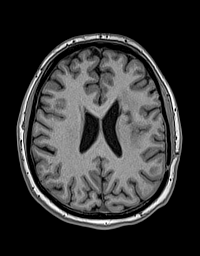
[im 120/160]
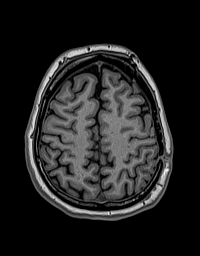
[im 140/160]
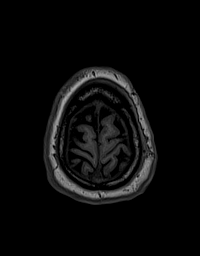
[im 160/160]
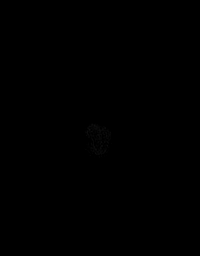

[Series 13: cor dwi_tracew · coronal · 5.0mm · 1.53mm/px · 3 of 58 slices shown]
[im 1/58]
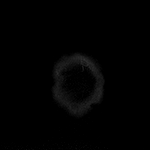
[im 29/58]
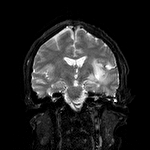
[im 58/58]
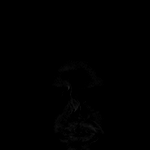

[Series 14: cor dwi_adc · coronal · 5.0mm · 1.53mm/px · 2 of 29 slices shown]
[im 1/29]
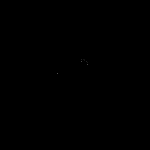
[im 29/29]
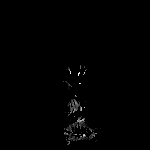

[Series 15: T2 post-contrast · coronal · 5.0mm · 0.57mm/px · 2 of 30 slices shown]
[im 1/30]
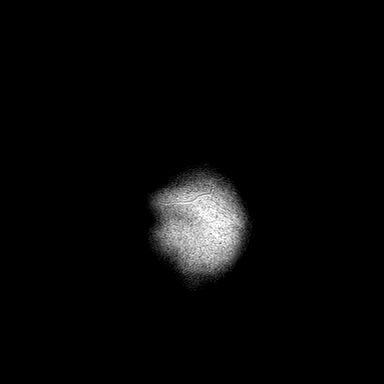
[im 30/30]
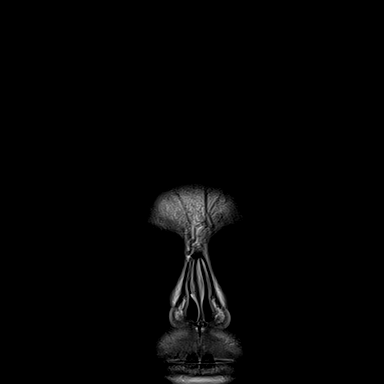

[Series 16: T1 post-contrast · axial · 1.0mm · 0.94mm/px · z∈[-30,+128]mm · 9 of 160 slices shown (1 of 3)]
[im 1/160]
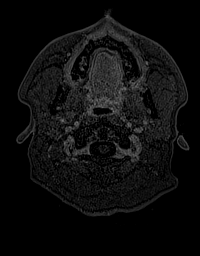
[im 20/160]
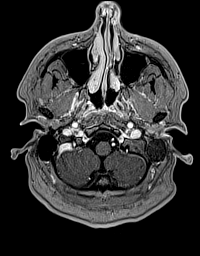
[im 40/160]
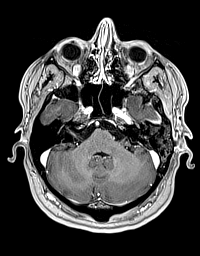
[im 60/160]
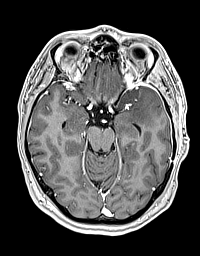
[im 80/160]
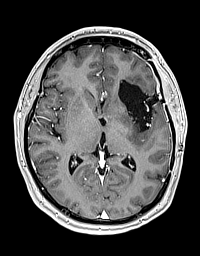
[im 100/160]
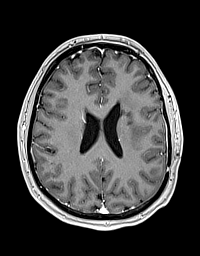
[im 120/160]
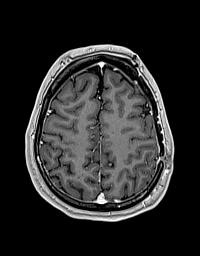
[im 140/160]
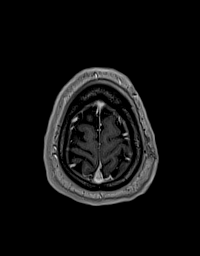
[im 160/160]
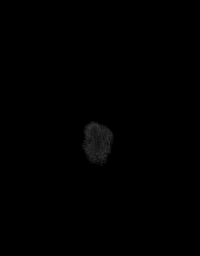

[Series 17: T1 post-contrast · coronal · 5.0mm · 0.43mm/px · 2 of 30 slices shown (2 of 3)]
[im 1/30]
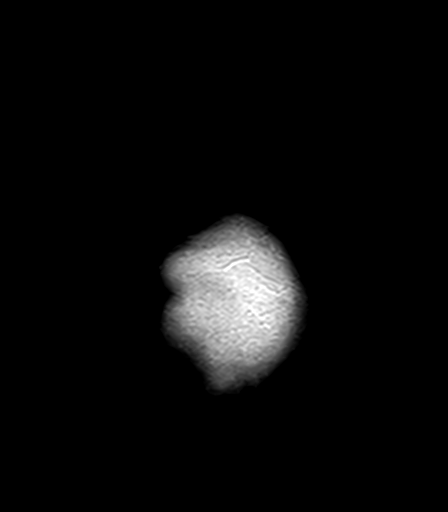
[im 30/30]
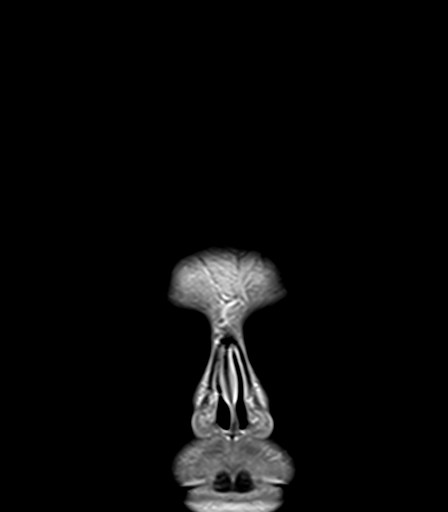

[Series 18: T1 post-contrast · sagittal · 5.0mm · 0.75mm/px · 2 of 27 slices shown (3 of 3)]
[im 1/27]
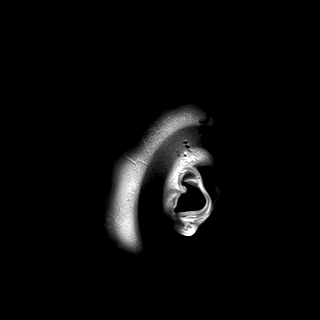
[im 27/27]
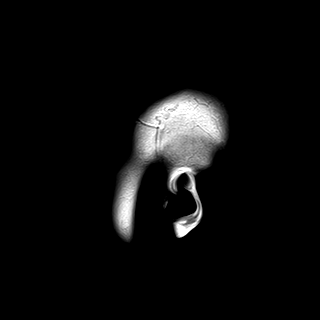

[48 of 48 positions shown; findings below may reference images not displayed]

FINDINGS: Brain: Left-sided craniotomy for tumor resection. Surgical cavity is
present in the left frontotemporal lobe with minimal chronic blood
products in the cavity. Surrounding the surgical cavity, there is
infiltrating mass in the left inferior frontal lobe and left
inferior temporal lobe. This is unchanged from the recent study of
[DATE] but appears progressive since [DATE], and [DATE].
No enhancing tumor is identified.

Ventricle size normal.  No midline shift.  No acute infarct.

Vascular: Normal arterial flow voids

Skull and upper cervical spine: Left-sided craniotomy.

Sinuses/Orbits: Mucosal edema paranasal sinuses. Left mastoid
effusion. Negative orbit

Other: None
IMPRESSION: Surgical resection cavity left frontotemporal lobe. Infiltrating
nonenhancing tumor surrounding the cavity is stable since the recent
study but has progressed from 1 year ago.

## 2021-09-29 MED ORDER — GADOBUTROL 1 MMOL/ML IV SOLN
9.0000 mL | Freq: Once | INTRAVENOUS | Status: AC | PRN
  Administered 2021-09-29: 9 mL via INTRAVENOUS

## 2021-09-30 ENCOUNTER — Inpatient Hospital Stay: Payer: BC Managed Care – PPO | Attending: Internal Medicine | Admitting: Internal Medicine

## 2021-09-30 VITALS — BP 120/77 | HR 79 | Temp 98.1°F | Resp 18 | Wt 194.7 lb

## 2021-09-30 DIAGNOSIS — R569 Unspecified convulsions: Secondary | ICD-10-CM | POA: Diagnosis not present

## 2021-09-30 DIAGNOSIS — Z79899 Other long term (current) drug therapy: Secondary | ICD-10-CM | POA: Diagnosis not present

## 2021-09-30 DIAGNOSIS — R5383 Other fatigue: Secondary | ICD-10-CM | POA: Insufficient documentation

## 2021-09-30 DIAGNOSIS — Z9622 Myringotomy tube(s) status: Secondary | ICD-10-CM | POA: Diagnosis not present

## 2021-09-30 DIAGNOSIS — C712 Malignant neoplasm of temporal lobe: Secondary | ICD-10-CM | POA: Insufficient documentation

## 2021-09-30 DIAGNOSIS — C719 Malignant neoplasm of brain, unspecified: Secondary | ICD-10-CM | POA: Diagnosis not present

## 2021-09-30 DIAGNOSIS — H6982 Other specified disorders of Eustachian tube, left ear: Secondary | ICD-10-CM | POA: Diagnosis not present

## 2021-09-30 NOTE — Progress Notes (Signed)
[place] [facility] [place], [place] [address] [phone]   Interval Evaluation  Date of Service: 09/30/21 Patient Name: Stacy Gutierrez Patient MRN: [id number] Patient DOB: 1991-02-01 Provider: Ventura Sellers, MD  Identifying Statement:  Stacy Gutierrez is a 30 y.o. 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 Tommi [facility]umps at [facility].  Path demonstrates WHO 2 diffuse astrocytoma, IDH mt   06/12/2021 Progression   Progression of disease #1   07/09/2021 Procedure   Oocyte retrieval at [place], prior to initiation of chemotherapy   07/21/2021 - 08/31/2021 Radiation Therapy   IMRT and concurrent Temodar with Dr. Isidore Moos    Biomarkers:  MGMT Unknown.  IDH 1/2 Mutated.  EGFR Unknown  TERT Unmutated   Interval History: Stacy Gutierrez presents today for follow up, now 1 month removed from IMRT and Temozolomide.  No further issues with her hearing, no other new complaints.  Otherwise remains active, fatigue is somewhat improved.  Denies further seizures, headaches.  Interested in returning to work in next couple of months.   H+P (06/22/21) Patient presents today as referral from Dr. Ferdinand Lango at [facility], for local radiation and chemotherapy for progressive glioma.  Patient describes breakthrough seizure episode earlier this month, leading to [place] [facility].  Vimpat was poorly tolerated, so Lamictal titration was initiated.  She is currently up to 44m BID, with plans to increase to 1087mBID. Also on Keppra 1500/2000.  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  [facility] team is recommending radiation and Temodar given recent progression noted on MRI.  No exposure to RT or chemo since diagnosis in  2019.  Medications: Current Outpatient Medications on File Prior to Visit  Medication Sig Dispense Refill   lamoTRIgine (LAMICTAL) 100 MG tablet Take 1 tablet (100 mg total) by mouth daily. (Patient taking differently: Take 100 mg by mouth 2 (two) times daily.) 60 tablet 2   levETIRAcetam (KEPPRA) 1000 MG tablet 150016my mouth twice daily     levETIRAcetam (KEPPRA) 1000 MG tablet Take 1 tablet by mouth See admin instructions. 1500 mg by mouth every morning and 2000 mg at bedtime     LORazepam (ATIVAN) 1 MG tablet Take 1 mg by mouth daily as needed.     ondansetron (ZOFRAN) 8 MG tablet Take 1 tablet (8 mg total) by mouth 2 (two) times daily as needed (nausea and vomiting). May take 30-60 minutes prior to Temodar administration if nausea/vomiting occurs. 30 tablet 1   pseudoephedrine (SUDAFED) 30 MG tablet Take 1-2 tablets (30-60 mg total) by mouth 3 (three) times daily. (I.e. take QAM, middday, and afternoon, approx 6 hours apart). Start with 23m36mse. 60 tablet 0   temozolomide (TEMODAR) 140 MG capsule Take 1 capsule (140 mg total) by mouth daily. May take on an empty stomach to decrease nausea & vomiting. 42 capsule 0   levonorgestrel (MIRENA, 52 MG,) 20 MCG/DAY IUD 1 each by Intrauterine route once for 1 dose. 1 each 0   No current facility-administered medications on file prior to visit.    Allergies: No Known Allergies Past Medical History:  Past Medical History:  Diagnosis Date   Astrocytoma (HCC)[place] Mononucleosis 01/22/2010   Seizures (  [place])    Vasovagal syncope    Past Surgical History:  Past Surgical History:  Procedure Laterality Date   CRANIOTOMY Left    TIBIA FRACTURE SURGERY  03/24/2010   Tibial Fracture-left leg--plate/screw--Dr. Linton Rump   TONSILLECTOMY     TYMPANOSTOMY TUBE PLACEMENT Left 08/26/2021   Social History:  Social History   Socioeconomic History   Marital status: Married    Spouse name: Not on file   Number of children: 0   Years of education: Not on  file   Highest education level: Not on file  Occupational History   Occupation: Ship broker  Tobacco Use   Smoking status: Never   Smokeless tobacco: Never  Vaping Use   Vaping Use: Never used  Substance and Sexual Activity   Alcohol use: No   Drug use: Not on file   Sexual activity: Yes    Partners: Male    Birth control/protection: I.U.D.  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   Social Determinants of Health   Financial Resource Strain: Not on file  Food Insecurity: Not on file  Transportation Needs: Not on file  Physical Activity: Not on file  Stress: Not on file  Social Connections: Not on file  Intimate Partner Violence: Not on file   Family History:  Family History  Problem Relation Age of Onset   Bladder Cancer Mother    Diabetes type II Maternal Grandmother        "heart issues"   Peripheral vascular disease Maternal Grandmother    Coronary artery disease Other    Diabetes Other    Hyperlipidemia Other    Stroke Other     Review of Systems: Constitutional: Doesn't report fevers, chills or abnormal weight loss Eyes: Doesn't report blurriness of vision Ears, nose, mouth, throat, and face: Doesn't report sore throat Respiratory: Doesn't report cough, dyspnea or wheezes Cardiovascular: Doesn't report palpitation, chest discomfort  Gastrointestinal:  Doesn't report nausea, constipation, diarrhea GU: Doesn't report incontinence Skin: Doesn't report skin rashes Neurological: Per HPI Musculoskeletal: Doesn't report joint pain Behavioral/Psych: Doesn't report anxiety  Physical Exam: Vitals:   09/30/21 0900  BP: 120/77  Pulse: 79  Resp: 18  Temp: 98.1 F (36.7 C)  SpO2: 98%   KPS: 90. General: Alert, cooperative, pleasant, in no acute distress Head: Normal EENT: No conjunctival injection or scleral icterus.  Lungs: Resp effort normal Cardiac:  Regular rate Abdomen: Non-distended abdomen Skin: No rashes cyanosis or petechiae. Extremities: No clubbing or edema  Neurologic Exam: Mental Status: Awake, alert, attentive to examiner. Oriented to self and environment. Language is fluent with intact comprehension.  Cranial Nerves: Visual acuity is grossly normal. Visual fields are full. Extra-ocular movements intact. No ptosis. Face is symmetric Motor: Tone and bulk are normal. Power is full in both arms and legs. Reflexes are symmetric, no pathologic reflexes present.  Sensory: Intact to light touch Gait: Normal.   Labs: I have reviewed the data as listed    Component Value Date/Time   NA 140 08/31/2021 0922   K 4.0 08/31/2021 0922   CL 105 08/31/2021 0922   CO2 28 08/31/2021 0922   GLUCOSE 79 08/31/2021 0922   BUN 15 08/31/2021 0922   CREATININE 0.84 08/31/2021 0922   CALCIUM 9.4 08/31/2021 0922   PROT 7.5 08/31/2021 0922   ALBUMIN 4.5 08/31/2021 0922   AST 24 08/31/2021 0350  ALT 37 08/31/2021 0922   ALKPHOS 64 08/31/2021 0922   BILITOT 0.5 08/31/2021 0922   GFRNONAA >60 08/31/2021 0922   Lab Results  Component Value Date   WBC 5.8 08/31/2021   NEUTROABS 4.3 08/31/2021   HGB 13.8 08/31/2021   HCT 40.5 08/31/2021   MCV 89.0 08/31/2021   PLT 238 08/31/2021    Imaging: 06/12/21   Imaging:  [place] Clinician Interpretation: I have personally reviewed the CNS images as listed.  My interpretation, in the context of the patient's clinical presentation, is stable disease  MR Brain W Wo Contrast  Result Date: 09/29/2021 CLINICAL DATA:  History of astrocytoma with craniotomy 3 years ago. Assess treatment response. EXAM: MRI HEAD WITHOUT AND WITH CONTRAST TECHNIQUE: Multiplanar, multiecho pulse sequences of the brain and surrounding structures were obtained without and with intravenous contrast. CONTRAST:  56m GADAVIST GADOBUTROL 1 MMOL/ML IV SOLN COMPARISON:  MRI head from [facility] 06/15/2021, 09/21/2020 FINDINGS: Brain:  Left-sided craniotomy for tumor resection. Surgical cavity is present in the left frontotemporal lobe with minimal chronic blood products in the cavity. Surrounding the surgical cavity, there is infiltrating mass in the left inferior frontal lobe and left inferior temporal lobe. This is unchanged from the recent study of 06/15/2021 but appears progressive since 09/21/2020, and 06/18/2019. No enhancing tumor is identified. Ventricle size normal.  No midline shift.  No acute infarct. Vascular: Normal arterial flow voids Skull and upper cervical spine: Left-sided craniotomy. Sinuses/Orbits: Mucosal edema paranasal sinuses. Left mastoid effusion. Negative orbit Other: None IMPRESSION: Surgical resection cavity left frontotemporal lobe. Infiltrating nonenhancing tumor surrounding the cavity is stable since the recent study but has progressed from 1 year ago. Electronically Signed   By: CFranchot GalloM.D.   On: 09/29/2021 11:43    Assessment/Plan [place]rocytoma ([place]  Seizures ([place]  Stacy CHALUPAis clinically stable today, now having completed 6 weeks of radiochemotherapy.  MRI demonstrates stability of burden of T2 signal abnormality, after prior scan (August) had demonstrated progression.  [facility] neuro-oncology team will formulate a treatment plan moving forward, which may include further oral chemo.   We discussed possibility of initiating treatment with cycle #1Temozolomide 150 mg/m2, on for five days and off for twenty three days in twenty eight day cycles. The patient will have a complete blood count performed on days 21 and 28 of each cycle, and a comprehensive metabolic panel performed on day 28 of each cycle. Labs may need to be performed more often. Zofran will prescribed for home use for nausea/vomiting.   Informed consent was obtained verbally at bedside to proceed with oral chemotherapy.  Chemotherapy should be held for the following:  ANC less than 1,000  Platelets less than 100,000  LFT  or creatinine greater than 2x ULN  If clinical concerns/contraindications develop  For seizures, she will continue on Keppra and Lamictal.  May dose sumatriptan, NSAIDs or Tylenol for breakthrough migraines.  She will follow up with Dr. PFerdinand Langoas scheduled on 10/05/21.  We are happy to remain involved in whatever capacity is needed.  All questions were answered. The patient knows to call the clinic with any problems, questions or concerns. No barriers to learning were detected.  The total time spent in the encounter was 40 minutes and more than 50% was on counseling and review of test results   ZVentura Sellers MD Medical Director of Neuro-Oncology [facility] [place]12/08/22 10:41 AM

## 2021-10-01 ENCOUNTER — Ambulatory Visit: Payer: BC Managed Care – PPO | Admitting: Radiation Oncology

## 2021-10-01 NOTE — Progress Notes (Incomplete)
Mrs. Stacy Gutierrez presents today for follow-up after completing radiation to her left frontal lobe on 08/31/2021  Dose of Decadron, if applicable: {:36016}  Recent neurologic symptoms, if any:  Seizures: {:18581} Headaches: {:18581} Nausea: {:18581} Dizziness/ataxia: {:18581} Difficulty with hand coordination: {:18581} Focal numbness/weakness: {:18581} Visual deficits/changes: {:18581} Confusion/Memory deficits: {:18581}  Additional Complaints / other details: ***Had Brain MRI w/ & w/o contrast on 09/29/2021 and F/U with medical oncologist Dr. Cecil Cobbs yesterday 09/30/2021

## 2021-10-04 ENCOUNTER — Encounter: Payer: Self-pay | Admitting: Internal Medicine

## 2021-10-05 ENCOUNTER — Telehealth: Payer: Self-pay | Admitting: Pharmacist

## 2021-10-05 ENCOUNTER — Other Ambulatory Visit (HOSPITAL_COMMUNITY): Payer: Self-pay

## 2021-10-05 ENCOUNTER — Encounter: Payer: Self-pay | Admitting: Internal Medicine

## 2021-10-05 ENCOUNTER — Other Ambulatory Visit: Payer: Self-pay | Admitting: Internal Medicine

## 2021-10-05 DIAGNOSIS — C719 Malignant neoplasm of brain, unspecified: Secondary | ICD-10-CM

## 2021-10-05 MED ORDER — TEMOZOLOMIDE 100 MG PO CAPS
150.0000 mg/m2/d | ORAL_CAPSULE | Freq: Every day | ORAL | 0 refills | Status: DC
  Filled 2021-10-05: qty 15, 5d supply, fill #0

## 2021-10-05 MED ORDER — TEMOZOLOMIDE 100 MG PO CAPS: 150.0000 mg/m2/d | ORAL_CAPSULE | Freq: Every day | ORAL | 0 refills | Status: AC

## 2021-10-05 MED ORDER — ONDANSETRON HCL 8 MG PO TABS
8.0000 mg | ORAL_TABLET | Freq: Two times a day (BID) | ORAL | 1 refills | Status: DC | PRN
  Filled 2021-10-05: qty 30, 15d supply, fill #0

## 2021-10-05 NOTE — Telephone Encounter (Signed)
Oral Oncology Pharmacist Encounter  Received new prescription for Temodar (temozolomide) for the treatment of astrocytoma, IDH mutant, planned duration 6-12 cycles.  CBC w/ Diff and CMP from 08/31/21 assessed, no relevant lab abnormalities noted. Prescription dose and frequency assessed for appropriateness.   Current medication list in Epic reviewed, no relevant/significant DDIs with Temodar identified.  Evaluated chart and no patient barriers to medication adherence noted.   Patient agreement for treatment documented in MD note on 09/30/21.  Prescription is required to be filled through [facility]. Prescription redirected for dispensing.   Oral Oncology Clinic will continue to follow for insurance authorization, copayment issues, initial counseling and start date.  Leron Croak, PharmD, BCPS Hematology/Oncology Clinical Pharmacist [facility] [place] [phone] 10/05/2021 9:52 AM

## 2021-10-05 NOTE — Progress Notes (Signed)
START ON PATHWAY REGIMEN - Neuro     A cycle is every 28 days:     Temozolomide      Temozolomide   **Always confirm dose/schedule in your pharmacy ordering system**  Patient Characteristics: Glioma, Grade 3 or 4 Astrocytoma, IDH-mutant, Recurrent or Progressive, Adjuvant Therapy, Systemic Therapy Candidate, BRAF V600E Mutation Negative/Unknown and NTRK Fusion Negative/Unknown Disease Classification: Glioma Disease Classification: Grade 3 or 4 Astrocytoma, IDH-mutant Disease Status: Recurrent or Progressive Treatment Classification: Adjuvant Therapy Treatment (Nonsurgical/Adjuvant): Systemic Therapy Candidate NTRK Gene Fusion Status: Did Not Order Test BRAF V600E Mutation Status: Did Not Order Test Intent of Therapy: Non-Curative / Palliative Intent, Discussed with Patient

## 2021-10-08 NOTE — Telephone Encounter (Signed)
Oral Chemotherapy Pharmacist Encounter   Attempted to reach patient to provide update and offer for initial counseling on oral medication: Temodar (temozolomide).   No answer. Left voicemail for patient to call back to discuss details of medication acquisition and initial counseling session.  Notified by [facility] that shipment has been set to ship out medication to patient on 10/11/21 for delivery of Temodar to patient's home next week.   Leron Croak, PharmD, BCPS Hematology/Oncology Clinical Pharmacist [facility] [phone] 10/08/2021 11:50 AM

## 2021-10-11 MED ORDER — ONDANSETRON HCL 8 MG PO TABS: 8.0000 mg | ORAL_TABLET | Freq: Two times a day (BID) | ORAL | 1 refills | Status: DC | PRN

## 2021-10-11 NOTE — Telephone Encounter (Signed)
Oral Chemotherapy Pharmacist Encounter  I spoke with patient for overview of: Temodar (temozolomide) for the maintenance treatment of astrocytoma, IDH mutant, planned duration 6-12 months of treatment.  Counseled on administration, dosing, side effects, monitoring, drug-food interactions, safe handling, storage, and disposal.  Patient will take Temodar 167m capsules, 3 capsules, 3059mtotal daily dose, by mouth once daily, may take at bedtime and on an empty stomach to decrease nausea and vomiting. Emphasized to patient that all 3 capsules will be taken at the same time once daily and not spread out throughout the day.   If 1st cycle is well tolerated, patient informed that Temodar dose may be increased to 200 mg/m2 daily for 5 days on, 23 days off, repeated every 28 days for subsequent cycles   Patient will take Temodar daily for 5 days on, 23 days off, and repeated.  Temodar start date: 10/15/21 PM (start date per patient)    Patient will take Zofran 52m11mablet, 1 tablet by mouth 30-60 min prior to Temodar dose to help decrease N/V. Patient [facility] be [facility] redirected.    Adverse effects include but are not limited to: nausea, vomiting, anorexia, GI upset, rash, drug fever, and fatigue. Rare but serious adverse effects of pneumocystis pneumonia and secondary malignancy also discussed.  We discussed strategies to manage constipation if they occur secondary to ondansetron dosing.  Reviewed importance of keeping a medication schedule and plan for any missed doses. No barriers to medication adherence identified.  Medication reconciliation performed and medication/allergy list updated.  Insurance authorization for Temodar has been obtained. Patient's insurance requires [facility]od[facility] already been set up for this per patient.   All questions answered.  Ms. KinEdison Paceiced  understanding and appreciation.   Medication education handout placed in mail for patient. Patient knows to call the office with questions or concerns. Oral Chemotherapy Clinic phone number provided to patient.   RebLeron CroakharmD, BCPS Hematology/Oncology Clinical Pharmacist [phone]/19/2022 2:27 PM

## 2021-10-13 ENCOUNTER — Other Ambulatory Visit (HOSPITAL_COMMUNITY): Payer: Self-pay

## 2021-10-14 ENCOUNTER — Encounter: Payer: Self-pay | Admitting: Internal Medicine

## 2021-10-14 NOTE — Progress Notes (Signed)
° °                                                                                                                                                          °  Patient Name: Stacy Gutierrez MRN: [id number] DOB: [birth year] Referring Physician: Cecil Cobbs Date of Service: 08/31/2021 [facility], [place]                                                        End Of Treatment Note  Diagnoses: C71.1-Malignant neoplasm of frontal lobe  Cancer Staging:  Cancer Staging  Astrocytoma [facility]) Staging form: Brain and Spinal Cord, AJCC 8th Edition - Pathologic: WHO Grade II - Signed by Ventura Sellers, MD on 06/22/2021 Stage prefix: Initial diagnosis Histologic grading system: 4 grade system Extent of surgical resection: Subtotal resection Solitary (s) or multifocal (m) tumors in the primary site: Solitary Tumor location in brain: Eloquent brain area Karnofsky performance status: Score 90 Seizures at presentation: Present IDH1 mutation: Positive   Intent: Curative  Radiation Treatment Dates: 07/21/2021 through 08/31/2021 Site Technique Total Dose (Gy) Dose per Fx (Gy) Completed Fx Beam Energies  Frontal Lobe: Brain_L_front IMRT 54/54 1.8 30/30 6X   Narrative: The patient tolerated radiation therapy relatively well.   Plan: The patient will follow-up with radiation oncology in 82mo. -----------------------------------  SEppie Gibson MD

## 2021-10-27 ENCOUNTER — Encounter: Payer: Self-pay | Admitting: Internal Medicine

## 2021-10-29 ENCOUNTER — Encounter: Payer: Self-pay | Admitting: Internal Medicine

## 2021-11-08 ENCOUNTER — Encounter: Payer: Self-pay | Admitting: Internal Medicine

## 2021-11-09 ENCOUNTER — Encounter: Payer: Self-pay | Admitting: *Deleted

## 2021-11-11 ENCOUNTER — Inpatient Hospital Stay (HOSPITAL_BASED_OUTPATIENT_CLINIC_OR_DEPARTMENT_OTHER): Payer: BC Managed Care – PPO | Admitting: Internal Medicine

## 2021-11-11 ENCOUNTER — Other Ambulatory Visit: Payer: Self-pay

## 2021-11-11 ENCOUNTER — Inpatient Hospital Stay: Payer: BC Managed Care – PPO | Attending: Internal Medicine

## 2021-11-11 VITALS — BP 103/70 | HR 78 | Temp 98.1°F | Resp 20 | Wt 188.1 lb

## 2021-11-11 DIAGNOSIS — Z79899 Other long term (current) drug therapy: Secondary | ICD-10-CM | POA: Diagnosis not present

## 2021-11-11 DIAGNOSIS — R112 Nausea with vomiting, unspecified: Secondary | ICD-10-CM | POA: Diagnosis not present

## 2021-11-11 DIAGNOSIS — C712 Malignant neoplasm of temporal lobe: Secondary | ICD-10-CM | POA: Insufficient documentation

## 2021-11-11 DIAGNOSIS — R569 Unspecified convulsions: Secondary | ICD-10-CM

## 2021-11-11 DIAGNOSIS — C719 Malignant neoplasm of brain, unspecified: Secondary | ICD-10-CM

## 2021-11-11 LAB — CMP (CANCER CENTER ONLY)
ALT: 23 U/L (ref 0–44)
AST: 18 U/L (ref 15–41)
Albumin: 4.3 g/dL (ref 3.5–5.0)
Alkaline Phosphatase: 58 U/L (ref 38–126)
Anion gap: 6 (ref 5–15)
BUN: 16 mg/dL (ref 6–20)
CO2: 30 mmol/L (ref 22–32)
Calcium: 9.5 mg/dL (ref 8.9–10.3)
Chloride: 103 mmol/L (ref 98–111)
Creatinine: 0.75 mg/dL (ref 0.44–1.00)
GFR, Estimated: >60 mL/min (ref >60)
Glucose, Bld: 91 mg/dL (ref 70–99)
Potassium: 3.6 mmol/L (ref 3.5–5.1)
Sodium: 139 mmol/L (ref 135–145)
Total Bilirubin: 0.4 mg/dL (ref 0.3–1.2)
Total Protein: 6.8 g/dL (ref 6.5–8.1)

## 2021-11-11 LAB — CBC WITH DIFFERENTIAL (CANCER CENTER ONLY)
Abs Immature Granulocytes: 0.07 10*3/uL (ref 0.00–0.07)
Basophils Absolute: 0 10*3/uL (ref 0.0–0.1)
Basophils Relative: 1 %
Eosinophils Absolute: 0.2 10*3/uL (ref 0.0–0.5)
Eosinophils Relative: 4 %
HCT: 38 % (ref 36.0–46.0)
Hemoglobin: 13.2 g/dL (ref 12.0–15.0)
Immature Granulocytes: 2 %
Lymphocytes Relative: 22 %
Lymphs Abs: 1 10*3/uL (ref 0.7–4.0)
MCH: 30 pg (ref 26.0–34.0)
MCHC: 34.7 g/dL (ref 30.0–36.0)
MCV: 86.4 fL (ref 80.0–100.0)
Monocytes Absolute: 0.4 10*3/uL (ref 0.1–1.0)
Monocytes Relative: 9 %
Neutro Abs: 3 10*3/uL (ref 1.7–7.7)
Neutrophils Relative %: 62 %
Platelet Count: 238 10*3/uL (ref 150–400)
RBC: 4.4 MIL/uL (ref 3.87–5.11)
RDW: 10.8 % — ABNORMAL LOW (ref 11.5–15.5)
WBC Count: 4.7 10*3/uL (ref 4.0–10.5)
nRBC: 0 % (ref 0.0–0.2)

## 2021-11-11 MED ORDER — TEMOZOLOMIDE 100 MG PO CAPS: 200.0000 mg/m2/d | ORAL_CAPSULE | Freq: Every day | ORAL | 0 refills | Status: AC

## 2021-11-11 NOTE — Progress Notes (Signed)
[facility] [address] [place], [place] [address] [phone]   Interval Evaluation  Date of Service: 11/11/21 Patient Name: Stacy Gutierrez Patient MRN: [id number] Patient DOB: 01/11/1991 Provider: Ventura Sellers, MD  Identifying Statement:  Stacy Gutierrez is a 31 y.o. 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 Tommi [facility] at [facility].  Path demonstrates WHO 2 diffuse astrocytoma, IDH mt   06/12/2021 Progression   Progression of disease #1   07/09/2021 Procedure   Oocyte retrieval at [place], prior to initiation of chemotherapy   07/21/2021 - 08/31/2021 Radiation Therapy   IMRT and concurrent Temodar with Dr. Isidore Moos   10/14/2021 -  Chemotherapy   Patient is on Treatment Plan : BRAIN Low Grade Glioma Grade II, Glioblastoma,  Astrocytoma, Oligodendroma, Recurrent or Progressive / Temozolomide D1-5 Q28 Days      Biomarkers:  MGMT Unknown.  IDH 1/2 Mutated.  EGFR Unknown  TERT Unmutated   Interval History: Stacy Gutierrez presents today for follow up, now having completed cycle #1 of 5-day Temodar.  No further issues with her hearing, no other new complaints.  Otherwise remains active, fatigue is somewhat improved.  Denies further seizures, headaches.  Interested in returning to work in next couple of months.   H+P (06/22/21) Patient presents today as referral from Dr. Ferdinand Lango at [facility], for local radiation and chemotherapy for progressive glioma.  Patient describes breakthrough seizure episode earlier this month, [place] [facility].  Vimpat was poorly tolerated, so Lamictal titration was initiated.  She is currently up to 61m BID, with plans to increase to 1034mBID. Also on Keppra 1500/2000.  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  [facility] team is recommending radiation and Temodar given recent progression noted on MRI.  No exposure to RT or chemo since diagnosis in 2019.  Medications: Current Outpatient Medications on File Prior to Visit  Medication Sig Dispense Refill   lamoTRIgine (LAMICTAL) 100 MG tablet Take 1 tablet (100 mg total) by mouth daily. (Patient taking differently: Take 100 mg by mouth 2 (two) times daily.) 60 tablet 2   levETIRAcetam (KEPPRA) 1000 MG tablet 150085my mouth twice daily     levETIRAcetam (KEPPRA) 1000 MG tablet Take 1 tablet by mouth See admin instructions. 1500 mg by mouth every morning and 2000 mg at bedtime     levonorgestrel (MIRENA, 52 MG,) 20 MCG/DAY IUD 1 each by Intrauterine route once for 1 dose. 1 each 0   LORazepam (ATIVAN) 1 MG tablet Take 1 mg by mouth daily as needed.     ondansetron (ZOFRAN) 8 MG tablet Take 1 tablet by mouth 2 times daily as needed (nausea and vomiting). May take 30 - 60 minutes prior to Temodar administration if nausea/vomiting occurs. 30 tablet 1   pseudoephedrine (SUDAFED) 30 MG tablet Take 1-2 tablets (30-60 mg total) by mouth 3 (three) times daily. (I.e. take QAM, middday, and afternoon, approx 6 hours apart). Start with 79m45mse. 60 tablet 0   No current facility-administered medications on file prior to visit.    Allergies: No Known Allergies Past Medical History:  Past Medical History:  Diagnosis Date   Astrocytoma (HCC)[place] Mononucleosis 01/22/2010  Seizures ([place])    Vasovagal syncope    Past Surgical History:  Past Surgical History:  Procedure Laterality Date   CRANIOTOMY Left    TIBIA FRACTURE SURGERY  03/24/2010   Tibial Fracture-left leg--plate/screw--Dr. Linton Rump   TONSILLECTOMY     TYMPANOSTOMY TUBE PLACEMENT Left 08/26/2021   Social History:  Social History   Socioeconomic History   Marital status: Married    Spouse name: Not on file   Number of children: 0   Years of education:  Not on file   Highest education level: Not on file  Occupational History   Occupation: Ship broker  Tobacco Use   Smoking status: Never   Smokeless tobacco: Never  Vaping Use   Vaping Use: Never used  Substance and Sexual Activity   Alcohol use: No   Drug use: Not on file   Sexual activity: Yes    Partners: Male    Birth control/protection: I.U.D.  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   Social Determinants of Health   Financial Resource Strain: Not on file  Food Insecurity: Not on file  Transportation Needs: Not on file  Physical Activity: Not on file  Stress: Not on file  Social Connections: Not on file  Intimate Partner Violence: Not on file   Family History:  Family History  Problem Relation Age of Onset   Bladder Cancer Mother    Diabetes type II Maternal Grandmother        "heart issues"   Peripheral vascular disease Maternal Grandmother    Coronary artery disease Other    Diabetes Other    Hyperlipidemia Other    Stroke Other     Review of Systems: Constitutional: Doesn't report fevers, chills or abnormal weight loss Eyes: Doesn't report blurriness of vision Ears, nose, mouth, throat, and face: Doesn't report sore throat Respiratory: Doesn't report cough, dyspnea or wheezes Cardiovascular: Doesn't report palpitation, chest discomfort  Gastrointestinal:  Doesn't report nausea, constipation, diarrhea GU: Doesn't report incontinence Skin: Doesn't report skin rashes Neurological: Per HPI Musculoskeletal: Doesn't report joint pain Behavioral/Psych: Doesn't report anxiety  Physical Exam: Vitals:   11/11/21 1135  BP: 103/70  Pulse: 78  Resp: 20  Temp: 98.1 F (36.7 C)  SpO2: 100%    KPS: 90. General: Alert, cooperative, pleasant, in no acute distress Head: Normal EENT: No conjunctival injection or scleral icterus.  Lungs: Resp effort  normal Cardiac: Regular rate Abdomen: Non-distended abdomen Skin: No rashes cyanosis or petechiae. Extremities: No clubbing or edema  Neurologic Exam: Mental Status: Awake, alert, attentive to examiner. Oriented to self and environment. Language is fluent with intact comprehension.  Cranial Nerves: Visual acuity is grossly normal. Visual fields are full. Extra-ocular movements intact. No ptosis. Face is symmetric Motor: Tone and bulk are normal. Power is full in both arms and legs. Reflexes are symmetric, no pathologic reflexes present.  Sensory: Intact to light touch Gait: Normal.   Labs: I have reviewed the data as listed    Component Value Date/Time   NA 140 08/31/2021 0922   K 4.0 08/31/2021 0922   CL 105 08/31/2021 0922   CO2 28 08/31/2021 0922   GLUCOSE 79 08/31/2021 0922   BUN 15 08/31/2021 0922   CREATININE 0.84 08/31/2021 0922   CALCIUM 9.4 08/31/2021 0922   PROT 7.5 08/31/2021 0922   ALBUMIN 4.5 08/31/2021 0922   AST 24 08/31/2021  0922   ALT 37 08/31/2021 0922   ALKPHOS 64 08/31/2021 0922   BILITOT 0.5 08/31/2021 0922   GFRNONAA >60 08/31/2021 0922   Lab Results  Component Value Date   WBC 4.7 11/11/2021   NEUTROABS 3.0 11/11/2021   HGB 13.2 11/11/2021   HCT 38.0 11/11/2021   MCV 86.4 11/11/2021   PLT 238 11/11/2021    Imaging: 06/12/21     Assessment/Plan Astrocytoma ([place])  Stacy Gutierrez is clinically stable today, now having completed cycle #1 of 5-day Temodar.    We recommended continuing treatment with cycle #2 Temozolomide, dose increased to 252m/m2, on for five days and off for twenty three days in twenty eight day cycles. The patient will have a complete blood count performed on days 21 and 28 of each cycle, and a comprehensive metabolic panel performed on day 28 of each cycle. Labs may need to be performed more often. Zofran will prescribed for home use for nausea/vomiting.   Informed consent was obtained verbally at bedside to proceed with  oral chemotherapy.  Chemotherapy should be held for the following:  ANC less than 1,000  Platelets less than 100,000  LFT or creatinine greater than 2x ULN  If clinical concerns/contraindications develop  For seizures, she will continue on Keppra and Lamictal.  May dose sumatriptan, NSAIDs or Tylenol for breakthrough migraines.  We ask that Stacy HELDTreturn to clinic in 1 months prior to cycle #3 with labs for evaluation, or sooner as needed.  All questions were answered. The patient knows to call the clinic with any problems, questions or concerns. No barriers to learning were detected.  The total time spent in the encounter was 30 minutes and more than 50% was on counseling and review of test results   ZVentura Sellers MD Medical Director of Neuro-Oncology [facility]at WLakeland01/19/23 11:33 AM

## 2021-11-12 ENCOUNTER — Telehealth: Payer: Self-pay | Admitting: Internal Medicine

## 2021-11-12 NOTE — Telephone Encounter (Signed)
Scheduled per 1/19 los, pt has been called and confirmed appt

## 2021-11-13 ENCOUNTER — Encounter: Payer: Self-pay | Admitting: Internal Medicine

## 2021-11-15 ENCOUNTER — Other Ambulatory Visit: Payer: BC Managed Care – PPO

## 2021-11-15 ENCOUNTER — Ambulatory Visit: Payer: BC Managed Care – PPO | Admitting: Internal Medicine

## 2021-11-26 ENCOUNTER — Telehealth: Payer: Self-pay

## 2021-11-26 NOTE — Telephone Encounter (Signed)
Notified Patient of need for signed Release of Information Form in order for Provider to complete form needed for travel. Form sent to e-mail provided by Patient. No other needs voiced at this time.

## 2021-12-03 NOTE — Telephone Encounter (Signed)
Awaiting return e-mail or patient response.   No receipt of authorization for release and form incomplete.   Patient needs to enter required "Signature of Traveler" on pages 2 and 4.   E-mailed completed [place] Use/Disclosure form and Coral [employer] 2 & 4 to e-mail address [email or web address] per other forms nurse initial request sent 11/26/2021 and to [email or web address] per EMR requesting signature and return to [email or web address].   Suggested alternative to sign upon next scheduled office visit Thursday, 12/09/2021.  Cover sheet currently prepared to fax [phone] or e-mail to [email or web address].

## 2021-12-07 DIAGNOSIS — C719 Malignant neoplasm of brain, unspecified: Secondary | ICD-10-CM | POA: Diagnosis not present

## 2021-12-07 DIAGNOSIS — C711 Malignant neoplasm of frontal lobe: Secondary | ICD-10-CM | POA: Diagnosis not present

## 2021-12-08 ENCOUNTER — Other Ambulatory Visit (HOSPITAL_COMMUNITY): Payer: Self-pay

## 2021-12-09 ENCOUNTER — Other Ambulatory Visit: Payer: Self-pay

## 2021-12-09 ENCOUNTER — Inpatient Hospital Stay (HOSPITAL_BASED_OUTPATIENT_CLINIC_OR_DEPARTMENT_OTHER): Payer: BC Managed Care – PPO | Admitting: Internal Medicine

## 2021-12-09 ENCOUNTER — Inpatient Hospital Stay: Payer: BC Managed Care – PPO | Attending: Internal Medicine

## 2021-12-09 VITALS — BP 115/80 | HR 80 | Temp 98.1°F | Resp 17 | Wt 193.0 lb

## 2021-12-09 DIAGNOSIS — C712 Malignant neoplasm of temporal lobe: Secondary | ICD-10-CM | POA: Insufficient documentation

## 2021-12-09 DIAGNOSIS — C719 Malignant neoplasm of brain, unspecified: Secondary | ICD-10-CM

## 2021-12-09 DIAGNOSIS — Z79899 Other long term (current) drug therapy: Secondary | ICD-10-CM | POA: Diagnosis not present

## 2021-12-09 DIAGNOSIS — R569 Unspecified convulsions: Secondary | ICD-10-CM | POA: Diagnosis not present

## 2021-12-09 DIAGNOSIS — G43909 Migraine, unspecified, not intractable, without status migrainosus: Secondary | ICD-10-CM | POA: Diagnosis not present

## 2021-12-09 LAB — CMP (CANCER CENTER ONLY)
ALT: 36 U/L (ref 0–44)
AST: 23 U/L (ref 15–41)
Albumin: 4.7 g/dL (ref 3.5–5.0)
Alkaline Phosphatase: 69 U/L (ref 38–126)
Anion gap: 6 (ref 5–15)
BUN: 15 mg/dL (ref 6–20)
CO2: 28 mmol/L (ref 22–32)
Calcium: 9.9 mg/dL (ref 8.9–10.3)
Chloride: 104 mmol/L (ref 98–111)
Creatinine: 0.71 mg/dL (ref 0.44–1.00)
GFR, Estimated: >60 mL/min (ref >60)
Glucose, Bld: 101 mg/dL — ABNORMAL HIGH (ref 70–99)
Potassium: 4.1 mmol/L (ref 3.5–5.1)
Sodium: 138 mmol/L (ref 135–145)
Total Bilirubin: 0.4 mg/dL (ref 0.3–1.2)
Total Protein: 7.4 g/dL (ref 6.5–8.1)

## 2021-12-09 LAB — CBC WITH DIFFERENTIAL (CANCER CENTER ONLY)
Abs Immature Granulocytes: 0.02 10*3/uL (ref 0.00–0.07)
Basophils Absolute: 0 10*3/uL (ref 0.0–0.1)
Basophils Relative: 1 %
Eosinophils Absolute: 0.2 10*3/uL (ref 0.0–0.5)
Eosinophils Relative: 3 %
HCT: 39.5 % (ref 36.0–46.0)
Hemoglobin: 14 g/dL (ref 12.0–15.0)
Immature Granulocytes: 0 %
Lymphocytes Relative: 17 %
Lymphs Abs: 1 10*3/uL (ref 0.7–4.0)
MCH: 30.9 pg (ref 26.0–34.0)
MCHC: 35.4 g/dL (ref 30.0–36.0)
MCV: 87.2 fL (ref 80.0–100.0)
Monocytes Absolute: 0.4 10*3/uL (ref 0.1–1.0)
Monocytes Relative: 7 %
Neutro Abs: 4.1 10*3/uL (ref 1.7–7.7)
Neutrophils Relative %: 72 %
Platelet Count: 239 10*3/uL (ref 150–400)
RBC: 4.53 MIL/uL (ref 3.87–5.11)
RDW: 11.6 % (ref 11.5–15.5)
WBC Count: 5.8 10*3/uL (ref 4.0–10.5)
nRBC: 0 % (ref 0.0–0.2)

## 2021-12-09 MED ORDER — TEMOZOLOMIDE 100 MG PO CAPS: 200.0000 mg/m2/d | ORAL_CAPSULE | Freq: Every day | ORAL | 0 refills | Status: AC

## 2021-12-09 NOTE — Progress Notes (Signed)
Stacy Gutierrez at [place], [place] [address] [phone]   Interval Evaluation  Date of Service: 12/09/21 Patient Name: Stacy Gutierrez Patient MRN: [id number] Patient DOB: [birth year] Provider: Ventura Sellers, MD  Identifying Statement:  Stacy Gutierrez is a 31 y.o. 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 Tommi Rumps at [facility].  Path demonstrates WHO 2 diffuse astrocytoma, IDH mt   06/12/2021 Progression   Progression of disease #1   07/09/2021 Procedure   Oocyte retrieval at [place], prior to initiation of chemotherapy   07/21/2021 - 08/31/2021 Radiation Therapy   IMRT and concurrent Temodar with Dr. Isidore Moos   10/14/2021 -  Chemotherapy   Patient is on Treatment Plan : BRAIN Low Grade Glioma Grade II, Glioblastoma,  Astrocytoma, Oligodendroma, Recurrent or Progressive / Temozolomide D1-5 Q28 Days      Biomarkers:  MGMT Unknown.  IDH 1/2 Mutated.  EGFR Unknown  TERT Unmutated   Interval History: Stacy Gutierrez presents today for follow up, now having completed cycle #2 of 5-day Temodar.  MRI was obtained and reviewed at [place] with Dr. Ferdinand Lango.  No further issues with her hearing, no other new complaints.  Otherwise remains active, fatigue is somewhat improved.  Denies further seizures, headaches.  Has trip to [place] upcoming this month.   H+P (06/22/21) Patient presents today as referral from Dr. Ferdinand Lango at [facility], for local radiation and chemotherapy for progressive glioma.  Patient describes breakthrough seizure episode earlier this month, [place] [facility].  Vimpat was poorly tolerated, so Lamictal titration was initiated.  She is currently up to 17m BID, with plans to increase to 1081mBID. Also on Keppra 1500/2000.  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  [facility] team is recommending radiation and Temodar given recent progression noted on MRI.  No exposure to RT or chemo since diagnosis in 2019.  Medications: Current Outpatient Medications on File Prior to Visit  Medication Sig Dispense Refill   lamoTRIgine (LAMICTAL) 100 MG tablet Take 1 tablet (100 mg total) by mouth daily. (Patient taking differently: Take 100 mg by mouth 2 (two) times daily.) 60 tablet 2   levETIRAcetam (KEPPRA) 1000 MG tablet 150037my mouth twice daily     levETIRAcetam (KEPPRA) 1000 MG tablet Take 1 tablet by mouth See admin instructions. 1500 mg by mouth every morning and 2000 mg at bedtime     levonorgestrel (MIRENA, 52 MG,) 20 MCG/DAY IUD 1 each by Intrauterine route once for 1 dose. 1 each 0   LORazepam (ATIVAN) 1 MG tablet Take 1 mg by mouth daily as needed.     ondansetron (ZOFRAN) 8 MG tablet Take 1 tablet by mouth 2 times daily as needed (nausea and vomiting). May take 30 - 60 minutes prior to Temodar administration if nausea/vomiting occurs. 30 tablet 1   SUMAtriptan (IMITREX) 100 MG tablet Take 1 tablet by mouth See admin instructions. Take 1 tablet by mouth at onset of headache.  May repeat in 2 hours as needed.  No more than 2 tablets in 24 hours.     No current facility-administered medications on file prior to visit.    Allergies: No Known Allergies Past Medical History:  Past Medical History:  Diagnosis Date   Astrocytoma ([place])    Mononucleosis 01/22/2010   Seizures ([place])    Vasovagal syncope    Past Surgical History:  Past Surgical History:  Procedure Laterality Date   CRANIOTOMY Left    TIBIA FRACTURE SURGERY  03/24/2010   Tibial Fracture-left leg--plate/screw--Dr. Linton Rump   TONSILLECTOMY     TYMPANOSTOMY TUBE PLACEMENT Left 08/26/2021   Social History:  Social History   Socioeconomic History   Marital status: Married    Spouse name: Not on file    Number of children: 0   Years of education: Not on file   Highest education level: Not on file  Occupational History   Occupation: Ship broker  Tobacco Use   Smoking status: Never   Smokeless tobacco: Never  Vaping Use   Vaping Use: Never used  Substance and Sexual Activity   Alcohol use: No   Drug use: Not on file   Sexual activity: Yes    Partners: Male    Birth control/protection: I.U.D.  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   Social Determinants of Health   Financial Resource Strain: Not on file  Food Insecurity: Not on file  Transportation Needs: Not on file  Physical Activity: Not on file  Stress: Not on file  Social Connections: Not on file  Intimate Partner Violence: Not on file   Family History:  Family History  Problem Relation Age of Onset   Bladder Cancer Mother    Diabetes type II Maternal Grandmother        "heart issues"   Peripheral vascular disease Maternal Grandmother    Coronary artery disease Other    Diabetes Other    Hyperlipidemia Other    Stroke Other     Review of Systems: Constitutional: Doesn't report fevers, chills or abnormal weight loss Eyes: Doesn't report blurriness of vision Ears, nose, mouth, throat, and face: Doesn't report sore throat Respiratory: Doesn't report cough, dyspnea or wheezes Cardiovascular: Doesn't report palpitation, chest discomfort  Gastrointestinal:  Doesn't report nausea, constipation, diarrhea GU: Doesn't report incontinence Skin: Doesn't report skin rashes Neurological: Per HPI Musculoskeletal: Doesn't report joint pain Behavioral/Psych: Doesn't report anxiety  Physical Exam: Vitals:   12/09/21 1218  BP: 115/80  Pulse: 80  Resp: 17  Temp: 98.1 F (36.7 C)  SpO2: 98%    KPS: 90. General: Alert, cooperative, pleasant, in no acute distress Head: Normal EENT: No conjunctival injection or  scleral icterus.  Lungs: Resp effort normal Cardiac: Regular rate Abdomen: Non-distended abdomen Skin: No rashes cyanosis or petechiae. Extremities: No clubbing or edema  Neurologic Exam: Mental Status: Awake, alert, attentive to examiner. Oriented to self and environment. Language is fluent with intact comprehension.  Cranial Nerves: Visual acuity is grossly normal. Visual fields are full. Extra-ocular movements intact. No ptosis. Face is symmetric Motor: Tone and bulk are normal. Power is full in both arms and legs. Reflexes are symmetric, no pathologic reflexes present.  Sensory: Intact to light touch Gait: Normal.   Labs: I have reviewed the data as listed    Component Value Date/Time   NA 139 11/11/2021 1116   K 3.6 11/11/2021 1116   CL 103 11/11/2021 1116   CO2 30 11/11/2021 1116   GLUCOSE 91 11/11/2021 1116   BUN 16 11/11/2021 1116   CREATININE 0.75 11/11/2021 1116   CALCIUM 9.5 11/11/2021 1116  PROT 6.8 11/11/2021 1116   ALBUMIN 4.3 11/11/2021 1116   AST 18 11/11/2021 1116   ALT 23 11/11/2021 1116   ALKPHOS 58 11/11/2021 1116   BILITOT 0.4 11/11/2021 1116   GFRNONAA >60 11/11/2021 1116   Lab Results  Component Value Date   WBC 4.7 11/11/2021   NEUTROABS 3.0 11/11/2021   HGB 13.2 11/11/2021   HCT 38.0 11/11/2021   MCV 86.4 11/11/2021   PLT 238 11/11/2021    Imaging: 06/12/21     Assessment/Plan Astrocytoma (HCC)  Seizures ([place])  Stacy Gutierrez is clinically and radiographically stable today, now having completed cycle #2 of 5-day Temodar.  No new or progressive changes.  We recommended continuing treatment with cycle #3 Temozolomide, dose increased to 273m/m2, on for five days and off for twenty three days in twenty eight day cycles. The patient will have a complete blood count performed on days 21 and 28 of each cycle, and a comprehensive metabolic panel performed on day 28 of each cycle. Labs may need to be performed more often. Zofran will  prescribed for home use for nausea/vomiting.   Informed consent was obtained verbally at bedside to proceed with oral chemotherapy.  Chemotherapy should be held for the following:  ANC less than 1,000  Platelets less than 100,000  LFT or creatinine greater than 2x ULN  If clinical concerns/contraindications develop  For seizures, she will continue on Keppra and Lamictal.  May dose sumatriptan, NSAIDs or Tylenol for breakthrough migraines.  We ask that Stacy FRAYreturn to clinic in 1 months prior to cycle #4 with labs for evaluation, or sooner as needed.  All questions were answered. The patient knows to call the clinic with any problems, questions or concerns. No barriers to learning were detected.  The total time spent in the encounter was 30 minutes and more than 50% was on counseling and review of test results   ZVentura Sellers MD Medical Director of Neuro-Oncology CBaptist St[facility]at WLake [place]02/16/23 9:48 AM

## 2021-12-10 ENCOUNTER — Encounter: Payer: Self-pay | Admitting: Internal Medicine

## 2021-12-10 ENCOUNTER — Telehealth: Payer: Self-pay | Admitting: Internal Medicine

## 2021-12-10 NOTE — Telephone Encounter (Signed)
Sch per 2/16 los, left msg

## 2021-12-13 ENCOUNTER — Other Ambulatory Visit: Payer: Self-pay | Admitting: Internal Medicine

## 2021-12-13 ENCOUNTER — Encounter: Payer: Self-pay | Admitting: *Deleted

## 2021-12-13 DIAGNOSIS — C719 Malignant neoplasm of brain, unspecified: Secondary | ICD-10-CM

## 2021-12-16 ENCOUNTER — Encounter: Payer: Self-pay | Admitting: Internal Medicine

## 2021-12-16 ENCOUNTER — Telehealth: Payer: Self-pay

## 2021-12-16 NOTE — Telephone Encounter (Deleted)
Attempted to contact Patient again regarding need for Release of Information Form. E-mailed Release of Information form to her once again. Message noted in Patient's chart to Provider stating that she has the form. Sent e-mail requesting that signed Release of Information from be returned even if Provider completed the form for her.

## 2021-12-16 NOTE — Telephone Encounter (Signed)
Attempted to contact Patient again regarding need for signed Release of Information form for travel form to be released. Form emailed to [email or web address] and [email or web address] Message noted in Patient's chart to Provider stating that she has the form already. No other needs noted at this time.

## 2022-01-06 ENCOUNTER — Inpatient Hospital Stay (HOSPITAL_BASED_OUTPATIENT_CLINIC_OR_DEPARTMENT_OTHER): Payer: BC Managed Care – PPO | Admitting: Internal Medicine

## 2022-01-06 ENCOUNTER — Other Ambulatory Visit: Payer: Self-pay

## 2022-01-06 ENCOUNTER — Inpatient Hospital Stay: Payer: BC Managed Care – PPO | Attending: Internal Medicine

## 2022-01-06 VITALS — BP 117/85 | HR 74 | Temp 97.7°F | Resp 18 | Ht 65.0 in | Wt 198.6 lb

## 2022-01-06 DIAGNOSIS — G43909 Migraine, unspecified, not intractable, without status migrainosus: Secondary | ICD-10-CM | POA: Diagnosis not present

## 2022-01-06 DIAGNOSIS — Z8052 Family history of malignant neoplasm of bladder: Secondary | ICD-10-CM | POA: Diagnosis not present

## 2022-01-06 DIAGNOSIS — C719 Malignant neoplasm of brain, unspecified: Secondary | ICD-10-CM | POA: Diagnosis not present

## 2022-01-06 DIAGNOSIS — C712 Malignant neoplasm of temporal lobe: Secondary | ICD-10-CM | POA: Insufficient documentation

## 2022-01-06 DIAGNOSIS — R569 Unspecified convulsions: Secondary | ICD-10-CM | POA: Insufficient documentation

## 2022-01-06 DIAGNOSIS — Z79899 Other long term (current) drug therapy: Secondary | ICD-10-CM | POA: Diagnosis not present

## 2022-01-06 LAB — CBC WITH DIFFERENTIAL (CANCER CENTER ONLY)
Abs Immature Granulocytes: 0.02 10*3/uL (ref 0.00–0.07)
Basophils Absolute: 0 10*3/uL (ref 0.0–0.1)
Basophils Relative: 0 %
Eosinophils Absolute: 0.2 10*3/uL (ref 0.0–0.5)
Eosinophils Relative: 4 %
HCT: 40.9 % (ref 36.0–46.0)
Hemoglobin: 14.2 g/dL (ref 12.0–15.0)
Immature Granulocytes: 0 %
Lymphocytes Relative: 16 %
Lymphs Abs: 1 10*3/uL (ref 0.7–4.0)
MCH: 30.7 pg (ref 26.0–34.0)
MCHC: 34.7 g/dL (ref 30.0–36.0)
MCV: 88.3 fL (ref 80.0–100.0)
Monocytes Absolute: 0.5 10*3/uL (ref 0.1–1.0)
Monocytes Relative: 9 %
Neutro Abs: 4.2 10*3/uL (ref 1.7–7.7)
Neutrophils Relative %: 71 %
Platelet Count: 258 10*3/uL (ref 150–400)
RBC: 4.63 MIL/uL (ref 3.87–5.11)
RDW: 11.9 % (ref 11.5–15.5)
WBC Count: 6 10*3/uL (ref 4.0–10.5)
nRBC: 0 % (ref 0.0–0.2)

## 2022-01-06 LAB — CMP (CANCER CENTER ONLY)
ALT: 32 U/L (ref 0–44)
AST: 21 U/L (ref 15–41)
Albumin: 4.7 g/dL (ref 3.5–5.0)
Alkaline Phosphatase: 66 U/L (ref 38–126)
Anion gap: 5 (ref 5–15)
BUN: 18 mg/dL (ref 6–20)
CO2: 29 mmol/L (ref 22–32)
Calcium: 10.1 mg/dL (ref 8.9–10.3)
Chloride: 103 mmol/L (ref 98–111)
Creatinine: 0.72 mg/dL (ref 0.44–1.00)
GFR, Estimated: >60 mL/min (ref >60)
Glucose, Bld: 89 mg/dL (ref 70–99)
Potassium: 4.1 mmol/L (ref 3.5–5.1)
Sodium: 137 mmol/L (ref 135–145)
Total Bilirubin: 0.5 mg/dL (ref 0.3–1.2)
Total Protein: 7.6 g/dL (ref 6.5–8.1)

## 2022-01-06 MED ORDER — TEMOZOLOMIDE 100 MG PO CAPS: 200.0000 mg/m2/d | ORAL_CAPSULE | Freq: Every day | ORAL | 0 refills | Status: AC

## 2022-01-06 NOTE — Progress Notes (Signed)
? ?[facility] [address]  ?[place],  [address] ?[phone] ? ? ?Interval Evaluation ? ?Date of Service: 01/06/22 ?Patient Name: Stacy Gutierrez ?Patient MRN: [id number] ?Patient DOB: [birth year] ?Provider: Ventura Sellers, MD ? ?Identifying Statement:  ?GILBERT NARAIN is a 31 y.o. 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 ? ?Oncologic History: ?Oncology History  ?Astrocytoma ([place])  ?04/05/2018 Surgery  ? Craniotomy, resection with Dr. Tommi Rumps at [facility].  Path demonstrates WHO 2 diffuse astrocytoma, IDH mt ?  ?06/12/2021 Progression  ? Progression of disease #1 ?  ?07/09/2021 Procedure  ? Oocyte retrieval at [facility], prior to initiation of chemotherapy ?  ?07/21/2021 - 08/31/2021 Radiation Therapy  ? IMRT and concurrent Temodar with Dr. Isidore Moos ?  ?10/14/2021 -  Chemotherapy  ? Patient is on Treatment Plan : BRAIN Low Grade Glioma Grade II, Glioblastoma,  Astrocytoma, Oligodendroma, Recurrent or Progressive / Temozolomide D1-5 Q28 Days  ?   ? ?Biomarkers: ? ?MGMT Unknown.  ?IDH 1/2 Mutated.  ?EGFR Unknown  ?TERT Unmutated  ? ?Interval History: ?Peterson Lombard presents today for follow up, now having completed cycle #3 of 5-day Temodar.  No further issues with her hearing, no other new complaints.  Otherwise remains active, fatigue is somewhat improved.  Denies further seizures, headaches.  Enjoyed greatly her time in [place]. ? ?H+P (06/22/21) Patient presents today as referral from Dr. Ferdinand Lango at [facility], for local radiation and chemotherapy for progressive glioma.  Patient describes breakthrough seizure episode earlier this month, leading [place] [facility].  Vimpat was poorly tolerated, so Lamictal titration was initiated.  She is currently up to 64m BID, with plans to increase to 1055mBID. Also on Keppra 1500/2000.  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  [facility] team is recommending radiation and Temodar given recent progression noted on MRI.  No exposure to RT or chemo since diagnosis in 2019. ? ?Medications: ?Current Outpatient Medications on File Prior to Visit  ?Medication Sig Dispense Refill  ? lamoTRIgine (LAMICTAL) 100 MG tablet Take 1 tablet (100 mg total) by mouth daily. (Patient taking differently: Take 100 mg by mouth 2 (two) times daily.) 60 tablet 2  ? levETIRAcetam (KEPPRA) 1000 MG tablet 150047my mouth twice daily    ? levETIRAcetam (KEPPRA) 1000 MG tablet Take 1 tablet by mouth See admin instructions. 1500 mg by mouth every morning and 2000 mg at bedtime    ? levonorgestrel (MIRENA, 52 MG,) 20 MCG/DAY IUD 1 each by Intrauterine route once for 1 dose. 1 each 0  ? LORazepam (ATIVAN) 1 MG tablet Take 1 mg by mouth daily as needed. (Patient not taking: Reported on 12/09/2021)    ? ondansetron (ZOFRAN) 8 MG tablet Take 1 tablet by mouth 2 times daily as needed (nausea and vomiting). May take 30 - 60 minutes prior to Temodar administration if nausea/vomiting occurs. (Patient not taking: Reported on 12/09/2021) 30 tablet 1  ? SUMAtriptan (IMITREX) 100 MG tablet Take 1 tablet by mouth See admin instructions. Take 1 tablet by mouth at onset of headache.  May repeat in 2 hours as needed.  No more than 2 tablets in 24 hours.    ? ?No current facility-administered medications on file prior to visit.  ? ? ?Allergies: No Known Allergies ?Past Medical History:  ?Past  Medical History:  ?Diagnosis Date  ? Astrocytoma ([place])   ? Mononucleosis 01/22/2010  ? Seizures ([place])   ? Vasovagal syncope   ? ?Past Surgical History:  ?Past Surgical History:  ?Procedure Laterality Date  ? CRANIOTOMY Left   ? TIBIA FRACTURE SURGERY  03/24/2010  ? Tibial Fracture-left leg--plate/screw--Dr. Linton Rump  ? TONSILLECTOMY    ? TYMPANOSTOMY TUBE PLACEMENT Left 08/26/2021  ? ?Social History:  ?Social History  ? ?Socioeconomic History  ? Marital status: Married  ?   Spouse name: Not on file  ? Number of children: 0  ? Years of education: Not on file  ? Highest education level: Not on file  ?Occupational History  ? Occupation: Ship broker  ?Tobacco Use  ? Smoking status: Never  ? Smokeless tobacco: Never  ?Vaping Use  ? Vaping Use: Never used  ?Substance and Sexual Activity  ? Alcohol use: No  ? Drug use: Not on file  ? Sexual activity: Yes  ?  Partners: Male  ?  Birth control/protection: I.U.D.  ?Other Topics Concern  ? Not on file  ?Social History Narrative  ? Married  ? 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 ?Social Determinants of Health  ? ?Financial Resource Strain: Not on file  ?Food Insecurity: Not on file  ?Transportation Needs: Not on file  ?Physical Activity: Not on file  ?Stress: Not on file  ?Social Connections: Not on file  ?Intimate Partner Violence: Not on file  ? ?Family History:  ?Family History  ?Problem Relation Age of Onset  ? Bladder Cancer Mother   ? Diabetes type II Maternal Grandmother   ?     "heart issues"  ? Peripheral vascular disease Maternal Grandmother   ? Coronary artery disease Other   ? Diabetes Other   ? Hyperlipidemia Other   ? Stroke Other   ? ? ?Review of Systems: ?Constitutional: Doesn't report fevers, chills or abnormal weight loss ?Eyes: Doesn't report blurriness of vision ?Ears, nose, mouth, throat, and face: Doesn't report sore throat ?Respiratory: Doesn't report cough, dyspnea or wheezes ?Cardiovascular: Doesn't report palpitation, chest discomfort  ?Gastrointestinal:  Doesn't report nausea, constipation, diarrhea ?GU: Doesn't report incontinence ?Skin: Doesn't report skin rashes ?Neurological: Per HPI ?Musculoskeletal: Doesn't report joint pain ?Behavioral/Psych: Doesn't report anxiety ? ?Physical Exam: ?Vitals:  ? 01/06/22 0956  ?BP: 117/85  ?Pulse: 74  ?Resp: 18  ?Temp: 97.7 ?F (36.5 ?C)  ?SpO2: 98%  ? ? ?KPS: 90. ?General: Alert, cooperative, pleasant, in no acute distress ?Head: Normal ?EENT: No  conjunctival injection or scleral icterus.  ?Lungs: Resp effort normal ?Cardiac: Regular rate ?Abdomen: Non-distended abdomen ?Skin: No rashes cyanosis or petechiae. ?Extremities: No clubbing or edema ? ?Neurologic Exam: ?Mental Status: Awake, alert, attentive to examiner. Oriented to self and environment. Language is fluent with intact comprehension.  ?Cranial Nerves: Visual acuity is grossly normal. Visual fields are full. Extra-ocular movements intact. No ptosis. Face is symmetric ?Motor: Tone and bulk are normal. Power is full in both arms and legs. Reflexes are symmetric, no pathologic reflexes present.  ?Sensory: Intact to light touch ?Gait: Normal. ? ? ?Labs: ?I have reviewed the data as listed ?   ?Component Value Date/Time  ? NA 138 12/09/2021 0939  ? K 4.1 12/09/2021 0939  ? CL 104 12/09/2021 0939  ? CO2 28 12/09/2021 0939  ? GLUCOSE 101 (H) 12/09/2021 0939  ? BUN 15 12/09/2021 0939  ? CREATININE 0.71 12/09/2021 0939  ? CALCIUM 9.9 12/09/2021 0939  ?  PROT 7.4 12/09/2021 0939  ? ALBUMIN 4.7 12/09/2021 0939  ? AST 23 12/09/2021 0939  ? ALT 36 12/09/2021 0939  ? ALKPHOS 69 12/09/2021 0939  ? BILITOT 0.4 12/09/2021 0939  ? GFRNONAA >60 12/09/2021 0939  ? ?Lab Results  ?Component Value Date  ? WBC 5.8 12/09/2021  ? NEUTROABS 4.1 12/09/2021  ? HGB 14.0 12/09/2021  ? HCT 39.5 12/09/2021  ? MCV 87.2 12/09/2021  ? PLT 239 12/09/2021  ? ? ?Imaging: ?06/12/21 ? ? ? ? ?Assessment/Plan ?Astrocytoma ([place]) ? ?Seizures ([place]) ? ?DOYNE ELLINGER is clinically stable today, now having completed cycle #3 of 5-day Temodar.  No new or progressive deficits noted. ? ?We recommended continuing treatment with cycle #4 Temozolomide 274m/m2, on for five days and off for twenty three days in twenty eight day cycles. The patient will have a complete blood count performed on days 21 and 28 of each cycle, and a comprehensive metabolic panel performed on day 28 of each cycle. Labs may need to be performed more often. Zofran will prescribed  for home use for nausea/vomiting.  ? ?Chemotherapy should be held for the following: ?? ANC less than 1,000 ?? Platelets less than 100,000 ?? LFT or creatinine greater than 2x ULN ?? If clinical concerns/c

## 2022-01-07 ENCOUNTER — Telehealth: Payer: Self-pay | Admitting: Internal Medicine

## 2022-01-07 NOTE — Telephone Encounter (Signed)
Scheduled per 3/16 los, message has been left with pt ?

## 2022-01-11 ENCOUNTER — Other Ambulatory Visit: Payer: Self-pay | Admitting: Radiation Therapy

## 2022-01-24 ENCOUNTER — Encounter: Payer: Self-pay | Admitting: *Deleted

## 2022-01-26 ENCOUNTER — Encounter: Payer: Self-pay | Admitting: Internal Medicine

## 2022-01-27 ENCOUNTER — Encounter: Payer: Self-pay | Admitting: Internal Medicine

## 2022-01-28 ENCOUNTER — Ambulatory Visit (HOSPITAL_COMMUNITY)
Admission: RE | Admit: 2022-01-28 | Discharge: 2022-01-28 | Disposition: A | Discharge status: Home / Self Care | Payer: BC Managed Care – PPO | Source: Ambulatory Visit | Attending: Internal Medicine | Admitting: Internal Medicine

## 2022-01-28 DIAGNOSIS — C719 Malignant neoplasm of brain, unspecified: Secondary | ICD-10-CM | POA: Diagnosis not present

## 2022-01-28 DIAGNOSIS — G319 Degenerative disease of nervous system, unspecified: Secondary | ICD-10-CM | POA: Diagnosis not present

## 2022-01-28 IMAGING — MR MR HEAD WO/W CM
13 series · 48 of 48 positions shown · IV contrast (gadavist)
Comparison: [DATE]

CLINICAL DATA: Brain/CNS neoplasm, assess treatment response;
diffuse astrocytoma post radiation and chemotherapy

EXAM:
MRI HEAD WITHOUT AND WITH CONTRAST
TECHNIQUE: Multiplanar, multiecho pulse sequences of the brain and surrounding
structures were obtained without and with intravenous contrast.
CONTRAST:  9mL GADAVIST GADOBUTROL 1 MMOL/ML IV SOLN

[Series 5: DWI · axial · 3.0mm · 1.36mm/px · z∈[-41,+118]mm · 7 of 108 slices shown (1 of 2)]
[im 1/108]
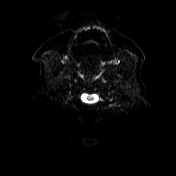
[im 18/108]
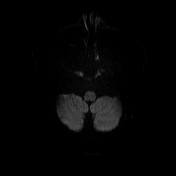
[im 36/108]
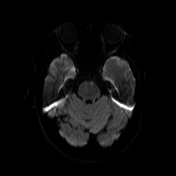
[im 54/108]
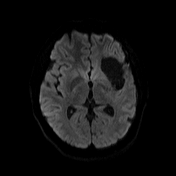
[im 72/108]
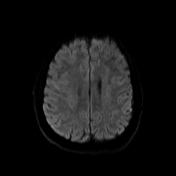
[im 90/108]
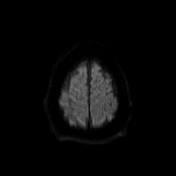
[im 108/108]
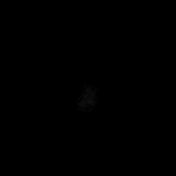

[Series 6: DWI · axial · 3.0mm · 1.36mm/px · z∈[-41,+118]mm · 3 of 54 slices shown (2 of 2)]
[im 1/54]
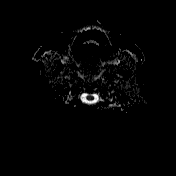
[im 27/54]
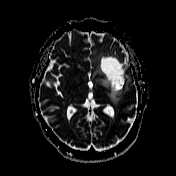
[im 54/54]
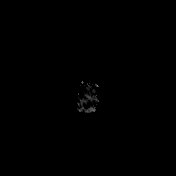

[Series 7: T1 · sagittal · 5.0mm · 0.75mm/px · 1 of 24 slices shown (1 of 2)]
[im 1/24]
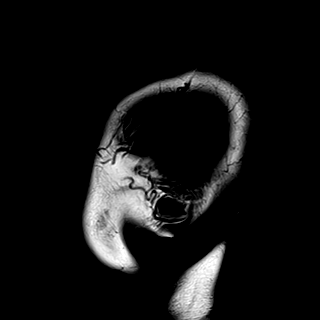

[Series 8: T2 · axial · 5.0mm · 0.62mm/px · 1 of 26 slices shown]
[im 1/26]
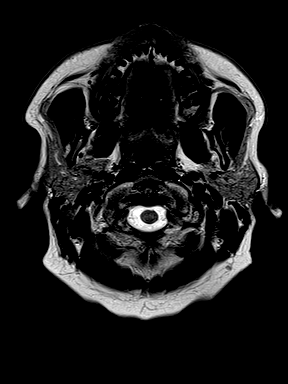

[Series 9: swi_images · axial · 3.0mm · 0.75mm/px · z∈[-40,+125]mm · 3 of 56 slices shown]
[im 1/56]
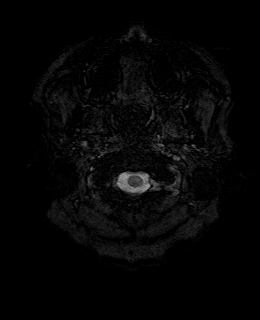
[im 28/56]
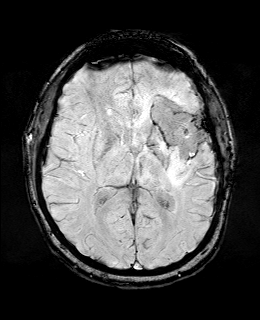
[im 56/56]
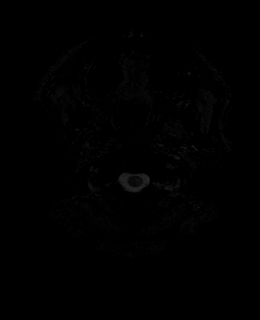

[Series 11: FLAIR · axial · 3.0mm · 0.75mm/px · z∈[-34,+119]mm · 3 of 52 slices shown]
[im 1/52]
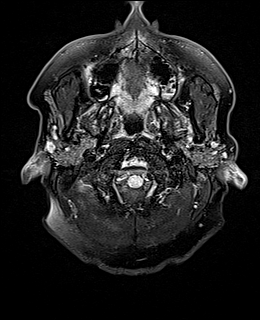
[im 26/52]
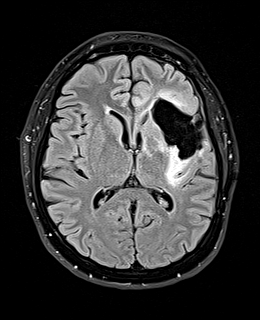
[im 52/52]
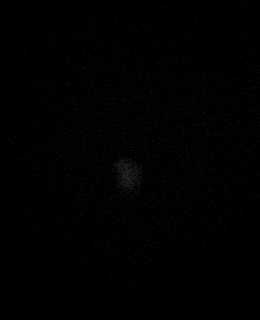

[Series 12: T1 · axial · 1.0mm · 0.94mm/px · z∈[-52,+122]mm · 10 of 176 slices shown (2 of 2)]
[im 1/176]
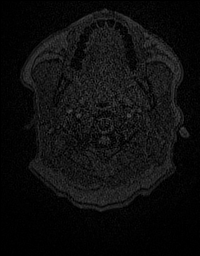
[im 20/176]
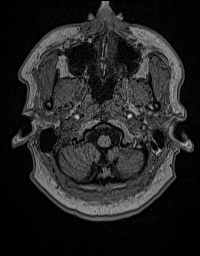
[im 39/176]
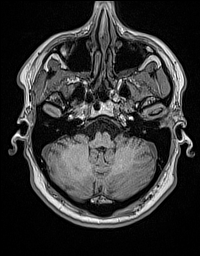
[im 59/176]
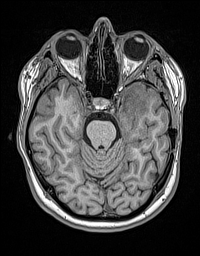
[im 78/176]
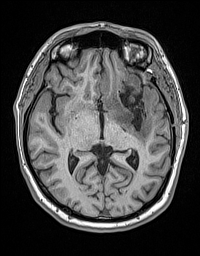
[im 98/176]
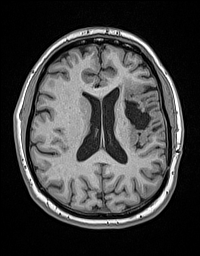
[im 117/176]
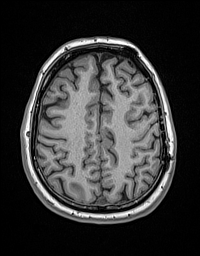
[im 137/176]
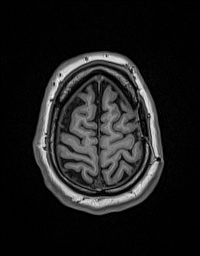
[im 156/176]
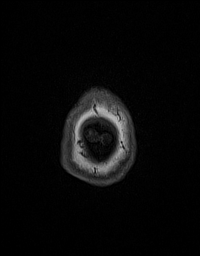
[im 176/176]
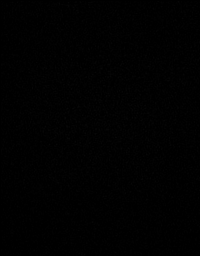

[Series 13: cor dwi_tracew · coronal · 5.0mm · 1.53mm/px · 3 of 56 slices shown]
[im 1/56]
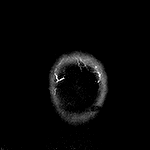
[im 28/56]
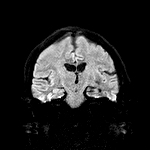
[im 56/56]
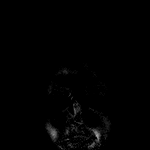

[Series 14: cor dwi_adc · coronal · 5.0mm · 1.53mm/px · 2 of 28 slices shown]
[im 1/28]
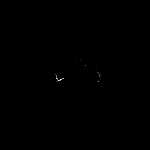
[im 28/28]
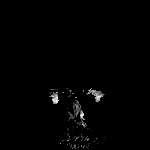

[Series 15: T2 post-contrast · coronal · 5.0mm · 0.57mm/px · 2 of 32 slices shown]
[im 1/32]
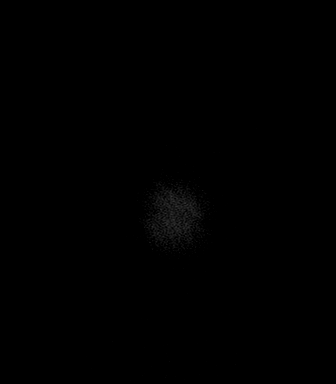
[im 32/32]
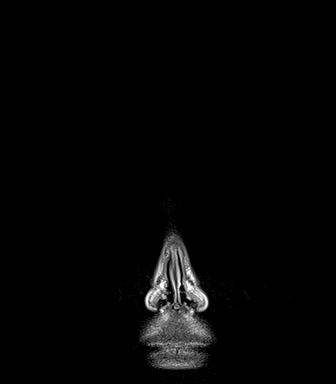

[Series 16: T1 post-contrast · axial · 1.0mm · 0.94mm/px · z∈[-52,+122]mm · 10 of 176 slices shown (1 of 3)]
[im 1/176]
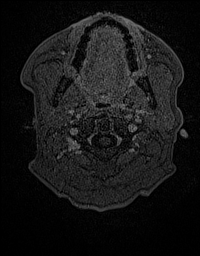
[im 20/176]
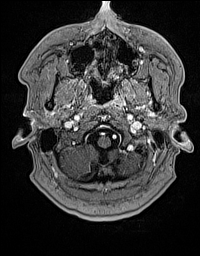
[im 39/176]
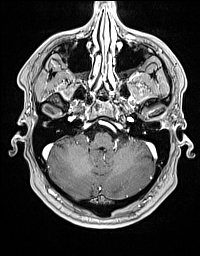
[im 59/176]
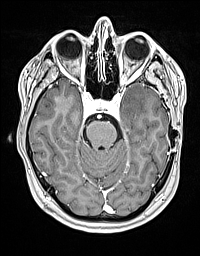
[im 78/176]
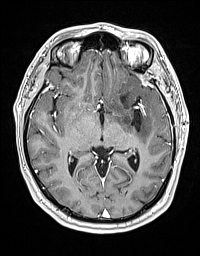
[im 98/176]
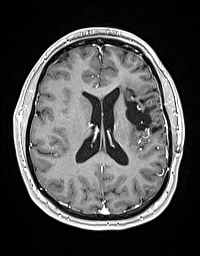
[im 117/176]
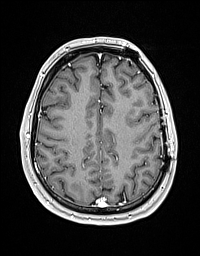
[im 137/176]
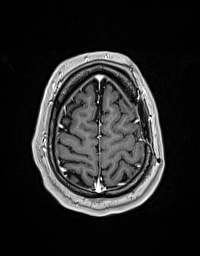
[im 156/176]
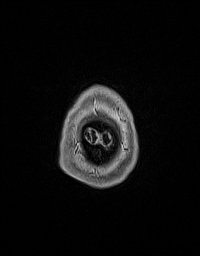
[im 176/176]
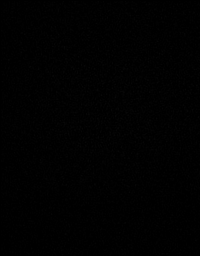

[Series 17: T1 post-contrast · coronal · 5.0mm · 0.43mm/px · 2 of 32 slices shown (2 of 3)]
[im 1/32]
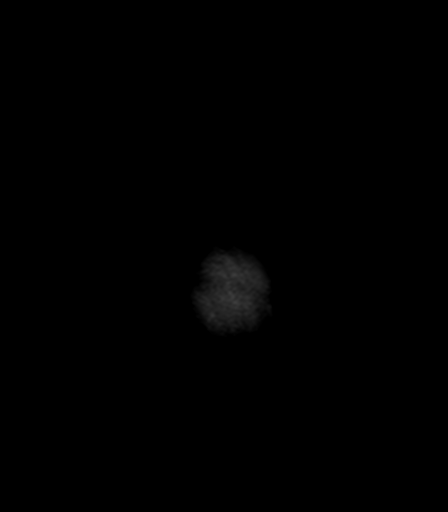
[im 32/32]
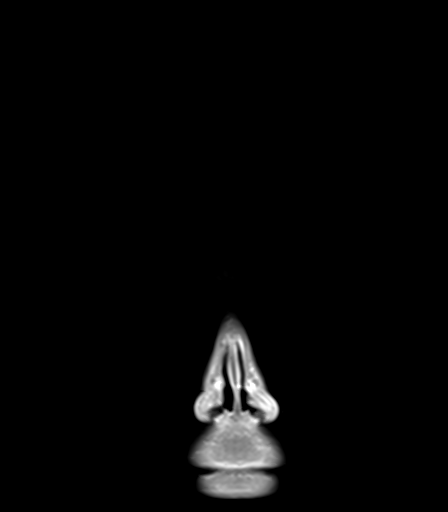

[Series 18: T1 post-contrast · sagittal · 5.0mm · 0.75mm/px · 1 of 24 slices shown (3 of 3)]
[im 1/24]
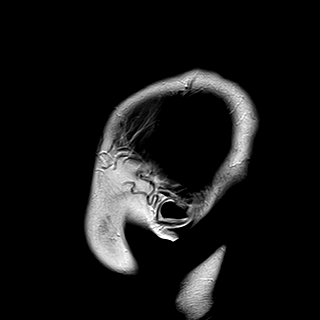

[48 of 48 positions shown; findings below may reference images not displayed]

FINDINGS: Brain: Hemosiderin lined CSF intensity resection cavity is again
identified in the left frontoinsular region. No new or suspicious
abnormal enhancement. Extent of surrounding T2 FLAIR hyperintensity
remains stable.

No acute infarction. Thin extra-axial collection underlies
craniotomy. Wallerian degeneration along the left cerebral peduncle.
No hydrocephalus.

Vascular: Major vessel flow voids at the skull base are preserved.

Skull and upper cervical spine: Normal marrow signal is preserved.

Sinuses/Orbits: Minor mucosal thickening.  Orbits are unremarkable.

Other: Sella is unremarkable.  Decreased left mastoid effusion.
IMPRESSION: No significant change since [DATE]. Stable extent of T2 FLAIR
hyperintensity and no new enhancement.

## 2022-01-28 MED ORDER — GADOBUTROL 1 MMOL/ML IV SOLN
9.0000 mL | Freq: Once | INTRAVENOUS | Status: AC | PRN
  Administered 2022-01-28: 9 mL via INTRAVENOUS

## 2022-02-03 ENCOUNTER — Ambulatory Visit: Payer: BC Managed Care – PPO | Admitting: Internal Medicine

## 2022-02-03 ENCOUNTER — Other Ambulatory Visit: Payer: BC Managed Care – PPO

## 2022-02-07 ENCOUNTER — Inpatient Hospital Stay: Payer: BC Managed Care – PPO | Attending: Internal Medicine

## 2022-02-07 ENCOUNTER — Other Ambulatory Visit: Payer: Self-pay

## 2022-02-07 ENCOUNTER — Inpatient Hospital Stay: Payer: BC Managed Care – PPO

## 2022-02-07 ENCOUNTER — Inpatient Hospital Stay (HOSPITAL_BASED_OUTPATIENT_CLINIC_OR_DEPARTMENT_OTHER): Payer: BC Managed Care – PPO | Admitting: Internal Medicine

## 2022-02-07 VITALS — BP 118/76 | HR 68 | Temp 98.1°F | Resp 20 | Wt 194.2 lb

## 2022-02-07 DIAGNOSIS — Z79899 Other long term (current) drug therapy: Secondary | ICD-10-CM | POA: Diagnosis not present

## 2022-02-07 DIAGNOSIS — R569 Unspecified convulsions: Secondary | ICD-10-CM

## 2022-02-07 DIAGNOSIS — C712 Malignant neoplasm of temporal lobe: Secondary | ICD-10-CM | POA: Insufficient documentation

## 2022-02-07 DIAGNOSIS — C719 Malignant neoplasm of brain, unspecified: Secondary | ICD-10-CM | POA: Diagnosis not present

## 2022-02-07 DIAGNOSIS — G43909 Migraine, unspecified, not intractable, without status migrainosus: Secondary | ICD-10-CM | POA: Diagnosis not present

## 2022-02-07 LAB — CBC WITH DIFFERENTIAL (CANCER CENTER ONLY)
Abs Immature Granulocytes: 0.05 10*3/uL (ref 0.00–0.07)
Basophils Absolute: 0 10*3/uL (ref 0.0–0.1)
Basophils Relative: 0 %
Eosinophils Absolute: 0.2 10*3/uL (ref 0.0–0.5)
Eosinophils Relative: 2 %
HCT: 40 % (ref 36.0–46.0)
Hemoglobin: 14.2 g/dL (ref 12.0–15.0)
Immature Granulocytes: 1 %
Lymphocytes Relative: 15 %
Lymphs Abs: 1.4 10*3/uL (ref 0.7–4.0)
MCH: 31.3 pg (ref 26.0–34.0)
MCHC: 35.5 g/dL (ref 30.0–36.0)
MCV: 88.1 fL (ref 80.0–100.0)
Monocytes Absolute: 0.9 10*3/uL (ref 0.1–1.0)
Monocytes Relative: 9 %
Neutro Abs: 7.1 10*3/uL (ref 1.7–7.7)
Neutrophils Relative %: 73 %
Platelet Count: 212 10*3/uL (ref 150–400)
RBC: 4.54 MIL/uL (ref 3.87–5.11)
RDW: 11.8 % (ref 11.5–15.5)
WBC Count: 9.7 10*3/uL (ref 4.0–10.5)
nRBC: 0 % (ref 0.0–0.2)

## 2022-02-07 LAB — CMP (CANCER CENTER ONLY)
ALT: 49 U/L — ABNORMAL HIGH (ref 0–44)
AST: 28 U/L (ref 15–41)
Albumin: 4.6 g/dL (ref 3.5–5.0)
Alkaline Phosphatase: 78 U/L (ref 38–126)
Anion gap: 7 (ref 5–15)
BUN: 15 mg/dL (ref 6–20)
CO2: 27 mmol/L (ref 22–32)
Calcium: 9.6 mg/dL (ref 8.9–10.3)
Chloride: 101 mmol/L (ref 98–111)
Creatinine: 0.69 mg/dL (ref 0.44–1.00)
GFR, Estimated: >60 mL/min (ref >60)
Glucose, Bld: 78 mg/dL (ref 70–99)
Potassium: 4.1 mmol/L (ref 3.5–5.1)
Sodium: 135 mmol/L (ref 135–145)
Total Bilirubin: 0.3 mg/dL (ref 0.3–1.2)
Total Protein: 7.6 g/dL (ref 6.5–8.1)

## 2022-02-07 NOTE — Progress Notes (Signed)
? ?[facility] [address]  ?[place], [place] [address] ?[phone] ? ? ?Interval Evaluation ? ?Date of Service: 02/07/22 ?Patient Name: Stacy Gutierrez ?Patient MRN: [id number] ?Patient DOB: December 09, 1990 ?Provider: Ventura Sellers, MD ? ?Identifying Statement:  ?Stacy Gutierrez is a 31 y.o. 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 ? ?Oncologic History: ?Oncology History  ?Astrocytoma ([place])  ?04/05/2018 Surgery  ? Craniotomy, resection with Dr. Tommi Rumps at [facility].  Path demonstrates WHO 2 diffuse astrocytoma, IDH mt ?  ?06/12/2021 Progression  ? Progression of disease #1 ?  ?07/09/2021 Procedure  ? Oocyte retrieval at [facility], prior to initiation of chemotherapy ?  ?07/21/2021 - 08/31/2021 Radiation Therapy  ? IMRT and concurrent Temodar with Dr. Isidore Moos ?  ?10/14/2021 -  Chemotherapy  ? Patient is on Treatment Plan : BRAIN Low Grade Glioma Grade II, Glioblastoma,  Astrocytoma, Oligodendroma, Recurrent or Progressive / Temozolomide D1-5 Q28 Days  ? ?  ?  ? ?Biomarkers: ? ?MGMT Unknown.  ?IDH 1/2 Mutated.  ?EGFR Unknown  ?TERT Unmutated  ? ?Interval History: ?Stacy Gutierrez presents today for follow up, now having completed cycle #4 of 5-day Temodar, and recent brain MRI.  No new or progressive changes reported today.  Otherwise remains active, fatigue is somewhat improved.  Denies further seizures, headaches.   ? ?H+P (06/22/21) Patient presents today as referral from Dr. Ferdinand Lango at [facility], for local radiation and chemotherapy for progressive glioma.  Patient describes breakthrough seizure episode earlier this month, leading [place] [facility].  Vimpat was poorly tolerated, so Lamictal titration was initiated.  She is currently up to 79m BID, with plans to increase to 1071mBID. Also on Keppra 1500/2000.  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  [facility] team is recommending radiation and Temodar given recent progression noted on MRI.  No exposure to RT or chemo since diagnosis in 2019. ? ?Medications: ?Current Outpatient Medications on File Prior to Visit  ?Medication Sig Dispense Refill  ? lamoTRIgine (LAMICTAL) 100 MG tablet Take 1 tablet (100 mg total) by mouth daily. (Patient taking differently: Take 100 mg by mouth 2 (two) times daily.) 60 tablet 2  ? levETIRAcetam (KEPPRA) 1000 MG tablet 150031my mouth twice daily    ? levETIRAcetam (KEPPRA) 1000 MG tablet Take 1 tablet by mouth See admin instructions. 1500 mg by mouth every morning and 2000 mg at bedtime    ? levonorgestrel (MIRENA, 52 MG,) 20 MCG/DAY IUD 1 each by Intrauterine route once for 1 dose. 1 each 0  ? LORazepam (ATIVAN) 1 MG tablet Take 1 mg by mouth daily as needed. (Patient not taking: Reported on 12/09/2021)    ? ondansetron (ZOFRAN) 8 MG tablet Take 1 tablet by mouth 2 times daily as needed (nausea and vomiting). May take 30 - 60 minutes prior to Temodar administration if nausea/vomiting occurs. (Patient not taking: Reported on 12/09/2021) 30 tablet 1  ? SUMAtriptan (IMITREX) 100 MG tablet Take 1 tablet by mouth See admin instructions. Take 1 tablet by mouth at onset of headache.  May repeat in 2 hours as needed.  No more than 2 tablets in 24 hours.    ? temozolomide (TEMODAR) 100 MG capsule Take by mouth.    ? ?No current facility-administered medications on file prior to visit.  ? ? ?  Allergies: No Known Allergies ?Past Medical History:  ?Past Medical History:  ?Diagnosis Date  ? Astrocytoma ([place])   ? Mononucleosis 01/22/2010  ? Seizures ([place])   ? Vasovagal syncope   ? ?Past Surgical History:  ?Past Surgical History:  ?Procedure Laterality Date  ? CRANIOTOMY Left   ? TIBIA FRACTURE SURGERY  03/24/2010  ? Tibial Fracture-left leg--plate/screw--Dr. Linton Rump  ? TONSILLECTOMY    ? TYMPANOSTOMY TUBE PLACEMENT Left 08/26/2021  ? ?Social History:  ?Social History  ? ?Socioeconomic  History  ? Marital status: Married  ?  Spouse name: Not on file  ? Number of children: 0  ? Years of education: Not on file  ? Highest education level: Not on file  ?Occupational History  ? Occupation: Ship broker  ?Tobacco Use  ? Smoking status: Never  ? Smokeless tobacco: Never  ?Vaping Use  ? Vaping Use: Never used  ?Substance and Sexual Activity  ? Alcohol use: No  ? Drug use: Not on file  ? Sexual activity: Yes  ?  Partners: Male  ?  Birth control/protection: I.U.D.  ?Other Topics Concern  ? Not on file  ?Social History Narrative  ? Married  ? 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 ?Social Determinants of Health  ? ?Financial Resource Strain: Not on file  ?Food Insecurity: Not on file  ?Transportation Needs: Not on file  ?Physical Activity: Not on file  ?Stress: Not on file  ?Social Connections: Not on file  ?Intimate Partner Violence: Not on file  ? ?Family History:  ?Family History  ?Problem Relation Age of Onset  ? Bladder Cancer Mother   ? Diabetes type II Maternal Grandmother   ?     "heart issues"  ? Peripheral vascular disease Maternal Grandmother   ? Coronary artery disease Other   ? Diabetes Other   ? Hyperlipidemia Other   ? Stroke Other   ? ? ?Review of Systems: ?Constitutional: Doesn't report fevers, chills or abnormal weight loss ?Eyes: Doesn't report blurriness of vision ?Ears, nose, mouth, throat, and face: Doesn't report sore throat ?Respiratory: Doesn't report cough, dyspnea or wheezes ?Cardiovascular: Doesn't report palpitation, chest discomfort  ?Gastrointestinal:  Doesn't report nausea, constipation, diarrhea ?GU: Doesn't report incontinence ?Skin: Doesn't report skin rashes ?Neurological: Per HPI ?Musculoskeletal: Doesn't report joint pain ?Behavioral/Psych: Doesn't report anxiety ? ?Physical Exam: ?Vitals:  ? 02/07/22 1225  ?BP: 118/76  ?Pulse: 68  ?Resp: 20  ?Temp: 98.1 ?F (36.7 ?C)  ?SpO2: 99%  ? ? ?KPS: 90. ?General: Alert, cooperative, pleasant, in no  acute distress ?Head: Normal ?EENT: No conjunctival injection or scleral icterus.  ?Lungs: Resp effort normal ?Cardiac: Regular rate ?Abdomen: Non-distended abdomen ?Skin: No rashes cyanosis or petechiae. ?Extremities: No clubbing or edema ? ?Neurologic Exam: ?Mental Status: Awake, alert, attentive to examiner. Oriented to self and environment. Language is fluent with intact comprehension.  ?Cranial Nerves: Visual acuity is grossly normal. Visual fields are full. Extra-ocular movements intact. No ptosis. Face is symmetric ?Motor: Tone and bulk are normal. Power is full in both arms and legs. Reflexes are symmetric, no pathologic reflexes present.  ?Sensory: Intact to light touch ?Gait: Normal. ? ? ?Labs: ?I have reviewed the data as listed ?   ?Component Value Date/Time  ? NA 137 01/06/2022 0933  ? K 4.1 01/06/2022 0933  ? CL 103 01/06/2022 0933  ? CO2 29 01/06/2022 0933  ? GLUCOSE 89 01/06/2022 0933  ? BUN 18 01/06/2022 0933  ? CREATININE 0.72  01/06/2022 0933  ? CALCIUM 10.1 01/06/2022 0933  ? PROT 7.6 01/06/2022 0933  ? ALBUMIN 4.7 01/06/2022 0933  ? AST 21 01/06/2022 0933  ? ALT 32 01/06/2022 0933  ? ALKPHOS 66 01/06/2022 0933  ? BILITOT 0.5 01/06/2022 0933  ? GFRNONAA >60 01/06/2022 0933  ? ?Lab Results  ?Component Value Date  ? WBC 6.0 01/06/2022  ? NEUTROABS 4.2 01/06/2022  ? HGB 14.2 01/06/2022  ? HCT 40.9 01/06/2022  ? MCV 88.3 01/06/2022  ? PLT 258 01/06/2022  ? ? ?Imaging: ? ?[facility] Clinician Interpretation: I have personally reviewed the CNS images as listed.  My interpretation, in the context of the patient's clinical presentation, is stable disease ? ?MR BRAIN W WO CONTRAST ? ?Result Date: 01/28/2022 ?CLINICAL DATA:  Brain/CNS neoplasm, assess treatment response; diffuse astrocytoma post radiation and chemotherapy EXAM: MRI HEAD WITHOUT AND WITH CONTRAST TECHNIQUE: Multiplanar, multiecho pulse sequences of the brain and surrounding structures were obtained without and with intravenous contrast. CONTRAST:   53m GADAVIST GADOBUTROL 1 MMOL/ML IV SOLN COMPARISON:  09/29/2021 FINDINGS: Brain: Hemosiderin lined CSF intensity resection cavity is again identified in the left frontoinsular region. No new or suspicious

## 2022-02-08 ENCOUNTER — Telehealth: Payer: Self-pay | Admitting: Internal Medicine

## 2022-02-08 ENCOUNTER — Other Ambulatory Visit: Payer: Self-pay | Admitting: Internal Medicine

## 2022-02-08 NOTE — Telephone Encounter (Signed)
Per 4/17 los called and spoke to pt about appointments.  Pt confirmed appointments  ?

## 2022-02-09 ENCOUNTER — Telehealth: Payer: Self-pay

## 2022-02-09 NOTE — Telephone Encounter (Signed)
Pt called to report that she is having bright red blood in her stool.s Feels fine other wise. No constipation and no hemorrhoids that she is aware of. She does not have a GI doctor. She is asking if this could be related to chemo. ? ?Routed to provider. ?

## 2022-02-09 NOTE — Telephone Encounter (Signed)
Called pt to advise pt per Dr. Mickeal Skinner Bloody stools not typically associated with temodar, blood counts are fine.  ? ?If it doesn?t resolve in 2-3 days we?ll have to arrange GI referral   ?  ?   ? ?

## 2022-02-10 ENCOUNTER — Telehealth: Payer: Self-pay | Admitting: *Deleted

## 2022-02-10 NOTE — Telephone Encounter (Signed)
Patient called to report no blood in stool as of yet today.  Advised that should give a couple of days if no more blood then she should just continue to monitor at home.  She will call back if that changes and referral to GI should be placed at that time. ?

## 2022-02-14 ENCOUNTER — Other Ambulatory Visit: Payer: Self-pay | Admitting: Internal Medicine

## 2022-03-07 ENCOUNTER — Other Ambulatory Visit: Payer: Self-pay

## 2022-03-07 ENCOUNTER — Inpatient Hospital Stay: Payer: BC Managed Care – PPO

## 2022-03-07 ENCOUNTER — Inpatient Hospital Stay: Payer: BC Managed Care – PPO | Attending: Internal Medicine | Admitting: Internal Medicine

## 2022-03-07 VITALS — BP 114/79 | HR 66 | Temp 97.6°F | Resp 18 | Ht 65.0 in | Wt 197.2 lb

## 2022-03-07 DIAGNOSIS — C719 Malignant neoplasm of brain, unspecified: Secondary | ICD-10-CM | POA: Diagnosis not present

## 2022-03-07 DIAGNOSIS — Z79899 Other long term (current) drug therapy: Secondary | ICD-10-CM | POA: Insufficient documentation

## 2022-03-07 DIAGNOSIS — C712 Malignant neoplasm of temporal lobe: Secondary | ICD-10-CM | POA: Insufficient documentation

## 2022-03-07 DIAGNOSIS — R569 Unspecified convulsions: Secondary | ICD-10-CM | POA: Diagnosis not present

## 2022-03-07 DIAGNOSIS — G43909 Migraine, unspecified, not intractable, without status migrainosus: Secondary | ICD-10-CM | POA: Insufficient documentation

## 2022-03-07 LAB — CBC WITH DIFFERENTIAL (CANCER CENTER ONLY)
Abs Immature Granulocytes: 0.04 10*3/uL (ref 0.00–0.07)
Basophils Absolute: 0 10*3/uL (ref 0.0–0.1)
Basophils Relative: 1 %
Eosinophils Absolute: 0.2 10*3/uL (ref 0.0–0.5)
Eosinophils Relative: 3 %
HCT: 37.8 % (ref 36.0–46.0)
Hemoglobin: 13.5 g/dL (ref 12.0–15.0)
Immature Granulocytes: 1 %
Lymphocytes Relative: 21 %
Lymphs Abs: 1.1 10*3/uL (ref 0.7–4.0)
MCH: 31.3 pg (ref 26.0–34.0)
MCHC: 35.7 g/dL (ref 30.0–36.0)
MCV: 87.5 fL (ref 80.0–100.0)
Monocytes Absolute: 0.6 10*3/uL (ref 0.1–1.0)
Monocytes Relative: 11 %
Neutro Abs: 3.4 10*3/uL (ref 1.7–7.7)
Neutrophils Relative %: 63 %
Platelet Count: 185 10*3/uL (ref 150–400)
RBC: 4.32 MIL/uL (ref 3.87–5.11)
RDW: 11.5 % (ref 11.5–15.5)
WBC Count: 5.3 10*3/uL (ref 4.0–10.5)
nRBC: 0 % (ref 0.0–0.2)

## 2022-03-07 LAB — CMP (CANCER CENTER ONLY)
ALT: 25 U/L (ref 0–44)
AST: 19 U/L (ref 15–41)
Albumin: 4.5 g/dL (ref 3.5–5.0)
Alkaline Phosphatase: 68 U/L (ref 38–126)
Anion gap: 4 — ABNORMAL LOW (ref 5–15)
BUN: 15 mg/dL (ref 6–20)
CO2: 30 mmol/L (ref 22–32)
Calcium: 9.7 mg/dL (ref 8.9–10.3)
Chloride: 103 mmol/L (ref 98–111)
Creatinine: 0.74 mg/dL (ref 0.44–1.00)
GFR, Estimated: >60 mL/min (ref >60)
Glucose, Bld: 116 mg/dL — ABNORMAL HIGH (ref 70–99)
Potassium: 4.2 mmol/L (ref 3.5–5.1)
Sodium: 137 mmol/L (ref 135–145)
Total Bilirubin: 0.3 mg/dL (ref 0.3–1.2)
Total Protein: 7.2 g/dL (ref 6.5–8.1)

## 2022-03-07 MED ORDER — LEVETIRACETAM 1000 MG PO TABS: 1000.0000 mg | ORAL_TABLET | ORAL | 2 refills | Status: DC

## 2022-03-07 MED ORDER — LAMOTRIGINE 100 MG PO TABS: 100.0000 mg | ORAL_TABLET | Freq: Two times a day (BID) | ORAL | 3 refills | Status: DC

## 2022-03-07 NOTE — Progress Notes (Signed)
? ?[facility] [address]  ?[place], [place] [address] ?[phone] ? ? ?Interval Evaluation ? ?Date of Service: 03/07/22 ?Patient Name: Stacy Gutierrez ?Patient MRN: [id number] ?Patient DOB: Jun 16, 1991 ?Provider: Ventura Sellers, MD ? ?Identifying Statement:  ?Stacy Gutierrez is a 31 y.o. 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 ? ?Oncologic History: ?Oncology History  ?Astrocytoma ([place])  ?04/05/2018 Surgery  ? Craniotomy, resection with Dr. Tommi Rumps at [facility].  Path demonstrates WHO 2 diffuse astrocytoma, IDH mt ?  ?06/12/2021 Progression  ? Progression of disease #1 ?  ?07/09/2021 Procedure  ? Oocyte retrieval at [facility], prior to initiation of chemotherapy ?  ?07/21/2021 - 08/31/2021 Radiation Therapy  ? IMRT and concurrent Temodar with Dr. Isidore Moos ?  ?10/14/2021 -  Chemotherapy  ? Patient is on Treatment Plan : BRAIN Low Grade Glioma Grade II, Glioblastoma,  Astrocytoma, Oligodendroma, Recurrent or Progressive / Temozolomide D1-5 Q28 Days  ? ?   ? ?Biomarkers: ? ?MGMT Unknown.  ?IDH 1/2 Mutated.  ?EGFR Unknown  ?TERT Unmutated  ? ?Interval History: ?Stacy Gutierrez presents today for follow up, now having completed cycle #5 of 5-day Temodar, and recent brain MRI.  No new or progressive changes reported today.  Otherwise remains active, fatigue is somewhat improved.  Denies further seizures, headaches.  Still grieving sudden and unexpected loss of her father.  ? ?H+P (06/22/21) Patient presents today as referral from Dr. Ferdinand Lango at [facility], for local radiation and chemotherapy for progressive glioma.  Patient describes breakthrough seizure episode earlier this month, leading to [place] [facility].  Vimpat was poorly tolerated, so Lamictal titration was initiated.  She is currently up to 76m BID, with plans to increase to 1027mBID. Also on Keppra 1500/2000.  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  [facility] team is recommending radiation and Temodar given recent progression noted on MRI.  No exposure to RT or chemo since diagnosis in 2019. ? ?Medications: ?Current Outpatient Medications on File Prior to Visit  ?Medication Sig Dispense Refill  ? lamoTRIgine (LAMICTAL) 100 MG tablet Take 1 tablet (100 mg total) by mouth daily. (Patient taking differently: Take 100 mg by mouth 2 (two) times daily.) 60 tablet 2  ? levETIRAcetam (KEPPRA) 1000 MG tablet 150058my mouth twice daily    ? levETIRAcetam (KEPPRA) 1000 MG tablet Take 1 tablet by mouth See admin instructions. 1500 mg by mouth every morning and 2000 mg at bedtime    ? levonorgestrel (MIRENA, 52 MG,) 20 MCG/DAY IUD 1 each by Intrauterine route once for 1 dose. 1 each 0  ? LORazepam (ATIVAN) 1 MG tablet Take 1 mg by mouth daily as needed. (Patient not taking: Reported on 12/09/2021)    ? ondansetron (ZOFRAN) 8 MG tablet Take 1 tablet by mouth 2 times daily as needed (nausea and vomiting). May take 30 - 60 minutes prior to Temodar administration if nausea/vomiting occurs. 30 tablet 1  ? SUMAtriptan (IMITREX) 100 MG tablet Take 1 tablet by mouth See admin instructions. Take 1 tablet by mouth at onset of headache.  May repeat in 2 hours as needed.  No more than 2 tablets in 24 hours. (Patient not taking: Reported on 02/07/2022)    ? temozolomide (TEMODAR) 100 MG capsule TAKE 4 CAPSULES DAILY FOR 5 DAYS. TAKE ON AN  EMPTY STOMACH TO DECREASE NAUSEA AND VOMITING 20 capsule 0  ? ?No current facility-administered medications on file prior to visit.  ? ? ?Allergies: No Known Allergies ?Past Medical History:  ?Past Medical History:  ?Diagnosis Date  ? Astrocytoma ([place])   ? Mononucleosis 01/22/2010  ? Seizures ([place])   ? Vasovagal syncope   ? ?Past Surgical History:  ?Past Surgical History:  ?Procedure Laterality Date  ? CRANIOTOMY Left   ? TIBIA FRACTURE SURGERY  03/24/2010  ? Tibial Fracture-left  leg--plate/screw--Dr. Linton Rump  ? TONSILLECTOMY    ? TYMPANOSTOMY TUBE PLACEMENT Left 08/26/2021  ? ?Social History:  ?Social History  ? ?Socioeconomic History  ? Marital status: Married  ?  Spouse name: Not on file  ? Number of children: 0  ? Years of education: Not on file  ? Highest education level: Not on file  ?Occupational History  ? Occupation: Ship broker  ?Tobacco Use  ? Smoking status: Never  ? Smokeless tobacco: Never  ?Vaping Use  ? Vaping Use: Never used  ?Substance and Sexual Activity  ? Alcohol use: No  ? Drug use: Not on file  ? Sexual activity: Yes  ?  Partners: Male  ?  Birth control/protection: I.U.D.  ?Other Topics Concern  ? Not on file  ?Social History Narrative  ? Married  ? 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 ?Social Determinants of Health  ? ?Financial Resource Strain: Not on file  ?Food Insecurity: Not on file  ?Transportation Needs: Not on file  ?Physical Activity: Not on file  ?Stress: Not on file  ?Social Connections: Not on file  ?Intimate Partner Violence: Not on file  ? ?Family History:  ?Family History  ?Problem Relation Age of Onset  ? Bladder Cancer Mother   ? Diabetes type II Maternal Grandmother   ?     "heart issues"  ? Peripheral vascular disease Maternal Grandmother   ? Coronary artery disease Other   ? Diabetes Other   ? Hyperlipidemia Other   ? Stroke Other   ? ? ?Review of Systems: ?Constitutional: Doesn't report fevers, chills or abnormal weight loss ?Eyes: Doesn't report blurriness of vision ?Ears, nose, mouth, throat, and face: Doesn't report sore throat ?Respiratory: Doesn't report cough, dyspnea or wheezes ?Cardiovascular: Doesn't report palpitation, chest discomfort  ?Gastrointestinal:  Doesn't report nausea, constipation, diarrhea ?GU: Doesn't report incontinence ?Skin: Doesn't report skin rashes ?Neurological: Per HPI ?Musculoskeletal: Doesn't report joint pain ?Behavioral/Psych: Doesn't report anxiety ? ?Physical Exam: ?Vitals:  ?  03/07/22 1229  ?BP: 114/79  ?Pulse: 66  ?Resp: 18  ?Temp: 97.6 ?F (36.4 ?C)  ?SpO2: 100%  ? ? ?KPS: 90. ?General: Alert, cooperative, pleasant, in no acute distress ?Head: Normal ?EENT: No conjunctival injection or scleral icterus.  ?Lungs: Resp effort normal ?Cardiac: Regular rate ?Abdomen: Non-distended abdomen ?Skin: No rashes cyanosis or petechiae. ?Extremities: No clubbing or edema ? ?Neurologic Exam: ?Mental Status: Awake, alert, attentive to examiner. Oriented to self and environment. Language is fluent with intact comprehension.  ?Cranial Nerves: Visual acuity is grossly normal. Visual fields are full. Extra-ocular movements intact. No ptosis. Face is symmetric ?Motor: Tone and bulk are normal. Power is full in both arms and legs. Reflexes are symmetric, no pathologic reflexes present.  ?Sensory: Intact to light touch ?Gait: Normal. ? ? ?Labs: ?I have reviewed the data as listed ?   ?Component Value Date/Time  ? NA 135 02/07/2022 1153  ? K 4.1 02/07/2022 1153  ? CL 101  02/07/2022 1153  ? CO2 27 02/07/2022 1153  ? GLUCOSE 78 02/07/2022 1153  ? BUN 15 02/07/2022 1153  ? CREATININE 0.69 02/07/2022 1153  ? CALCIUM 9.6 02/07/2022 1153  ? PROT 7.6 02/07/2022 1153  ? ALBUMIN 4.6 02/07/2022 1153  ? AST 28 02/07/2022 1153  ? ALT 49 (H) 02/07/2022 1153  ? ALKPHOS 78 02/07/2022 1153  ? BILITOT 0.3 02/07/2022 1153  ? GFRNONAA >60 02/07/2022 1153  ? ?Lab Results  ?Component Value Date  ? WBC 5.3 03/07/2022  ? NEUTROABS 3.4 03/07/2022  ? HGB 13.5 03/07/2022  ? HCT 37.8 03/07/2022  ? MCV 87.5 03/07/2022  ? PLT 185 03/07/2022  ? ? ? ?Assessment/Plan ?Astrocytoma ([place]) ? ?Seizures ([place]) ? ?Stacy Gutierrez is clinically stable today, now having completed cycle #5 of 5-day Temodar.  No new or progressive changes today. ? ?We recommended continuing treatment with cycle #6 Temozolomide 236m/m2, on for five days and off for twenty three days in twenty eight day cycles. The patient will have a complete blood count performed on days  21 and 28 of each cycle, and a comprehensive metabolic panel performed on day 28 of each cycle. Labs may need to be performed more often. Zofran will prescribed for home use for nausea/vomiting.  ? ?Chemotherapy should b

## 2022-03-08 ENCOUNTER — Telehealth: Payer: Self-pay | Admitting: *Deleted

## 2022-03-08 NOTE — Telephone Encounter (Signed)
Patient called to report start of recent refill of Lamictal 100 mg (states that manufacturer changed  and shape and color are different now).  States she took it last night, woke up with headache, took this mornings dose and headache is still present.  States she had an issue with another medication in the past and when the manufacturer switched she didn't have any issues.   ? ?Spoke with Dr Mickeal Skinner and he doesn't feel like it is related to the new medication.   ?

## 2022-03-15 ENCOUNTER — Telehealth: Payer: Self-pay | Admitting: Internal Medicine

## 2022-03-15 NOTE — Telephone Encounter (Signed)
.  Called patient to schedule appointment per 5/22 inbasket, left msg for pt

## 2022-03-23 ENCOUNTER — Other Ambulatory Visit: Payer: Self-pay | Admitting: Internal Medicine

## 2022-03-23 DIAGNOSIS — C719 Malignant neoplasm of brain, unspecified: Secondary | ICD-10-CM

## 2022-03-23 MED ORDER — TEMOZOLOMIDE 100 MG PO CAPS: 200.0000 mg/m2/d | ORAL_CAPSULE | Freq: Every day | ORAL | 0 refills | Status: AC

## 2022-03-28 ENCOUNTER — Other Ambulatory Visit (HOSPITAL_COMMUNITY): Payer: Self-pay

## 2022-03-28 ENCOUNTER — Telehealth: Payer: Self-pay

## 2022-03-28 ENCOUNTER — Other Ambulatory Visit: Payer: Self-pay | Admitting: Internal Medicine

## 2022-03-28 MED ORDER — LEVETIRACETAM 1000 MG PO TABS: 1000.0000 mg | ORAL_TABLET | ORAL | 2 refills | Status: DC

## 2022-03-28 MED ORDER — LAMOTRIGINE 100 MG PO TABS: 100.0000 mg | ORAL_TABLET | Freq: Two times a day (BID) | ORAL | 3 refills | Status: DC

## 2022-03-28 NOTE — Telephone Encounter (Signed)
Pt called with concerns that her Temodar is under review by her insurance and was wondering if there was anything Dr. Mickeal Skinner could do regarding this. She is also wanting a refill on her seizure medications, Keppra and Lamictal as well. This LPN made Dr.Vaslow aware.

## 2022-03-29 ENCOUNTER — Other Ambulatory Visit (HOSPITAL_COMMUNITY): Payer: Self-pay

## 2022-03-29 ENCOUNTER — Telehealth: Payer: Self-pay | Admitting: Pharmacy Technician

## 2022-03-29 ENCOUNTER — Encounter: Payer: Self-pay | Admitting: Internal Medicine

## 2022-03-29 DIAGNOSIS — H6982 Other specified disorders of Eustachian tube, left ear: Secondary | ICD-10-CM | POA: Diagnosis not present

## 2022-03-29 DIAGNOSIS — Z9622 Myringotomy tube(s) status: Secondary | ICD-10-CM | POA: Diagnosis not present

## 2022-03-29 NOTE — Telephone Encounter (Signed)
Oral Oncology Patient Advocate Encounter  Prior Authorization for Temozolomide (Temodar)  has been approved.    PA#  [id number] Effective dates: 03/29/2022 through 03/28/2023  Patients co-pay is $457.27.    Lady Deutscher, CPhT-Adv Pharmacy Patient Advocate Specialist [place] Patient Advocate Team Direct Number: [phone]  Fax: [phone]

## 2022-03-29 NOTE — Telephone Encounter (Signed)
Oral Oncology Patient Advocate Encounter   Received notification that prior authorization for Temozolomide (Temodar) is required.   PA submitted on 03/29/2022 Key [id number] Status is pending     Stacy Gutierrez, CPhT-Adv Pharmacy Patient Advocate Specialist [place] Patient Advocate Team Direct Number: [phone]  Fax: [phone]

## 2022-03-30 ENCOUNTER — Telehealth: Payer: Self-pay

## 2022-03-30 NOTE — Telephone Encounter (Signed)
Stacy Gutierrez wanted Dr. Mickeal Skinner to know that she had an aura yesterday morning that lasted ~3 hours. She felt "off " She took 1 mg of ativan SL at 1 pm and repeated ativan 1 mg at 1:30 pm. She stated that she felt better shortly after the second dose.  She has 3 tablets of ativan left.  She would like a refill sent in to [facility] at [address] in [place] today if possible.  She would like to be notified when med sent in so she can go and pick it up.  Pt has appointment on 04-18-22 for lab and visit.

## 2022-03-31 ENCOUNTER — Encounter: Payer: Self-pay | Admitting: Internal Medicine

## 2022-03-31 MED ORDER — LORAZEPAM 1 MG PO TABS: 1.0000 mg | ORAL_TABLET | Freq: Every day | ORAL | 0 refills | Status: DC | PRN

## 2022-03-31 NOTE — Addendum Note (Signed)
Addended by: Ventura Sellers on: 03/31/2022 08:59 AM   Modules accepted: Orders

## 2022-04-01 ENCOUNTER — Telehealth: Payer: Self-pay

## 2022-04-01 NOTE — Telephone Encounter (Signed)
Spoke with pt to confirm Ativan prescription had been e-scribed to pt's pharmacy.

## 2022-04-12 ENCOUNTER — Other Ambulatory Visit: Payer: Self-pay | Admitting: Internal Medicine

## 2022-04-12 DIAGNOSIS — C719 Malignant neoplasm of brain, unspecified: Secondary | ICD-10-CM | POA: Diagnosis not present

## 2022-04-12 NOTE — Telephone Encounter (Signed)
Patient will be seen by Dr Mickeal Skinner and have labs prior to refilling TMZ.

## 2022-04-14 ENCOUNTER — Ambulatory Visit
Admission: RE | Admit: 2022-04-14 | Discharge: 2022-04-14 | Disposition: A | Discharge status: Home / Self Care | Payer: Self-pay | Source: Ambulatory Visit | Attending: Internal Medicine | Admitting: Internal Medicine

## 2022-04-14 ENCOUNTER — Other Ambulatory Visit: Payer: Self-pay | Admitting: *Deleted

## 2022-04-14 DIAGNOSIS — C719 Malignant neoplasm of brain, unspecified: Secondary | ICD-10-CM

## 2022-04-18 ENCOUNTER — Other Ambulatory Visit: Payer: BC Managed Care – PPO

## 2022-04-18 ENCOUNTER — Ambulatory Visit: Payer: BC Managed Care – PPO | Admitting: Internal Medicine

## 2022-05-02 ENCOUNTER — Inpatient Hospital Stay: Payer: BC Managed Care – PPO | Attending: Internal Medicine | Admitting: Internal Medicine

## 2022-05-02 ENCOUNTER — Encounter: Payer: Self-pay | Admitting: Internal Medicine

## 2022-05-02 ENCOUNTER — Other Ambulatory Visit: Payer: Self-pay

## 2022-05-02 ENCOUNTER — Inpatient Hospital Stay: Payer: BC Managed Care – PPO

## 2022-05-02 DIAGNOSIS — C712 Malignant neoplasm of temporal lobe: Secondary | ICD-10-CM | POA: Diagnosis not present

## 2022-05-02 DIAGNOSIS — C719 Malignant neoplasm of brain, unspecified: Secondary | ICD-10-CM

## 2022-05-02 DIAGNOSIS — G43909 Migraine, unspecified, not intractable, without status migrainosus: Secondary | ICD-10-CM | POA: Diagnosis not present

## 2022-05-02 DIAGNOSIS — R5383 Other fatigue: Secondary | ICD-10-CM | POA: Diagnosis not present

## 2022-05-02 DIAGNOSIS — R569 Unspecified convulsions: Secondary | ICD-10-CM | POA: Diagnosis not present

## 2022-05-02 DIAGNOSIS — Z79899 Other long term (current) drug therapy: Secondary | ICD-10-CM | POA: Diagnosis not present

## 2022-05-02 LAB — CBC WITH DIFFERENTIAL (CANCER CENTER ONLY)
Abs Immature Granulocytes: 0.02 10*3/uL (ref 0.00–0.07)
Basophils Absolute: 0 10*3/uL (ref 0.0–0.1)
Basophils Relative: 1 %
Eosinophils Absolute: 0.2 10*3/uL (ref 0.0–0.5)
Eosinophils Relative: 4 %
HCT: 37.8 % (ref 36.0–46.0)
Hemoglobin: 13.3 g/dL (ref 12.0–15.0)
Immature Granulocytes: 0 %
Lymphocytes Relative: 20 %
Lymphs Abs: 1.3 10*3/uL (ref 0.7–4.0)
MCH: 31 pg (ref 26.0–34.0)
MCHC: 35.2 g/dL (ref 30.0–36.0)
MCV: 88.1 fL (ref 80.0–100.0)
Monocytes Absolute: 0.6 10*3/uL (ref 0.1–1.0)
Monocytes Relative: 9 %
Neutro Abs: 4.3 10*3/uL (ref 1.7–7.7)
Neutrophils Relative %: 66 %
Platelet Count: 213 10*3/uL (ref 150–400)
RBC: 4.29 MIL/uL (ref 3.87–5.11)
RDW: 11.4 % — ABNORMAL LOW (ref 11.5–15.5)
WBC Count: 6.4 10*3/uL (ref 4.0–10.5)
nRBC: 0 % (ref 0.0–0.2)

## 2022-05-02 LAB — CMP (CANCER CENTER ONLY)
ALT: 21 U/L (ref 0–44)
AST: 17 U/L (ref 15–41)
Albumin: 4.7 g/dL (ref 3.5–5.0)
Alkaline Phosphatase: 63 U/L (ref 38–126)
Anion gap: 6 (ref 5–15)
BUN: 13 mg/dL (ref 6–20)
CO2: 29 mmol/L (ref 22–32)
Calcium: 9.8 mg/dL (ref 8.9–10.3)
Chloride: 103 mmol/L (ref 98–111)
Creatinine: 0.82 mg/dL (ref 0.44–1.00)
GFR, Estimated: >60 mL/min (ref >60)
Glucose, Bld: 84 mg/dL (ref 70–99)
Potassium: 4.1 mmol/L (ref 3.5–5.1)
Sodium: 138 mmol/L (ref 135–145)
Total Bilirubin: 0.3 mg/dL (ref 0.3–1.2)
Total Protein: 7.4 g/dL (ref 6.5–8.1)

## 2022-05-02 MED ORDER — TEMOZOLOMIDE 100 MG PO CAPS: 200.0000 mg/m2/d | ORAL_CAPSULE | Freq: Every day | ORAL | 0 refills | Status: AC

## 2022-05-02 NOTE — Progress Notes (Signed)
[facility] [address] [place], [place] [address] [phone]   Interval Evaluation  Date of Service: 05/02/22 Patient Name: Stacy Gutierrez Patient MRN: [id number] Patient DOB: 1991/09/13 Provider: Ventura Sellers, MD  Identifying Statement:  Stacy Gutierrez is a 31 y.o. 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 Tommi Rumps at [facility].  Path demonstrates WHO 2 diffuse astrocytoma, IDH mt   06/12/2021 Progression   Progression of disease #1   07/09/2021 Procedure   Oocyte retrieval at [facility], prior to initiation of chemotherapy   07/21/2021 - 08/31/2021 Radiation Therapy   IMRT and concurrent Temodar with Dr. Isidore Moos   10/14/2021 -  Chemotherapy   Patient is on Treatment Plan : BRAIN Low Grade Glioma Grade II, Glioblastoma,  Astrocytoma, Oligodendroma, Recurrent or Progressive / Temozolomide D1-5 Q28 Days      Biomarkers:  MGMT Unknown.  IDH 1/2 Mutated.  EGFR Unknown  TERT Unmutated   Interval History: Stacy Gutierrez presents today for follow up, now having completed cycle #6 of 5-day Temodar, and recent brain MRI.  She did experience one seizures last month after dosing the chemo, but had missed a couple doses of Lamictal.  Otherwise no new or progressive changes reported today.  Remains active, fatigue is stable overall.  Denies further headaches.    H+P (06/22/21) Patient presents today as referral from Dr. Ferdinand Lango at [facility], for local radiation and chemotherapy for progressive glioma.  Patient describes breakthrough seizure episode earlier this month, leading to hospitalization [place] [facility].  Vimpat was poorly tolerated, so Lamictal titration was initiated.  She is currently up to 32m BID, with plans to increase to 1039mBID. Also on Keppra 1500/2000.  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  [facility] team is recommending radiation and Temodar given recent progression noted on MRI.  No exposure to RT or chemo since diagnosis in 2019.  Medications: Current Outpatient Medications on File Prior to Visit  Medication Sig Dispense Refill   lamoTRIgine (LAMICTAL) 100 MG tablet Take 1 tablet (100 mg total) by mouth 2 (two) times daily. 60 tablet 3   levETIRAcetam (KEPPRA) 1000 MG tablet Take 1 tablet (1,000 mg total) by mouth See admin instructions. 1500 mg by mouth every morning and 2000 mg at bedtime 120 tablet 2   levonorgestrel (MIRENA, 52 MG,) 20 MCG/DAY IUD 1 each by Intrauterine route once for 1 dose. 1 each 0   LORazepam (ATIVAN) 1 MG tablet Take 1 tablet (1 mg total) by mouth daily as needed. 30 tablet 0   ondansetron (ZOFRAN) 8 MG tablet Take 1 tablet by mouth 2 times daily as needed (nausea and vomiting). May take 30 - 60 minutes prior to Temodar administration if nausea/vomiting occurs. 30 tablet 1   SUMAtriptan (IMITREX) 100 MG tablet Take 1 tablet by mouth See admin instructions. Take 1 tablet by mouth at onset of headache.  May repeat in 2 hours as needed.  No more than 2 tablets in 24 hours. (Patient not taking: Reported on 02/07/2022)     temozolomide (TEMODAR) 100 MG capsule TAKE 4 CAPSULES DAILY FOR 5 DAYS. TAKE ON AN EMPTY STOMACH TO DECREASE NAUSEA AND VOMITING (Patient not taking: Reported on  03/07/2022) 20 capsule 0   No current facility-administered medications on file prior to visit.    Allergies: No Known Allergies Past Medical History:  Past Medical History:  Diagnosis Date   Astrocytoma ([place])    Mononucleosis 01/22/2010   Seizures ([place])    Vasovagal syncope    Past Surgical History:  Past Surgical History:  Procedure Laterality Date   CRANIOTOMY Left    TIBIA FRACTURE SURGERY  03/24/2010   Tibial Fracture-left leg--plate/screw--Dr. Linton Rump   TONSILLECTOMY      TYMPANOSTOMY TUBE PLACEMENT Left 08/26/2021   Social History:  Social History   Socioeconomic History   Marital status: Married    Spouse name: Not on file   Number of children: 0   Years of education: Not on file   Highest education level: Not on file  Occupational History   Occupation: Ship broker  Tobacco Use   Smoking status: Never   Smokeless tobacco: Never  Vaping Use   Vaping Use: Never used  Substance and Sexual Activity   Alcohol use: No   Drug use: Not on file   Sexual activity: Yes    Partners: Male    Birth control/protection: I.U.D.  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   Social Determinants of Health   Financial Resource Strain: Not on file  Food Insecurity: Not on file  Transportation Needs: Not on file  Physical Activity: Not on file  Stress: Not on file  Social Connections: Not on file  Intimate Partner Violence: Not on file   Family History:  Family History  Problem Relation Age of Onset   Bladder Cancer Mother    Diabetes type II Maternal Grandmother        "heart issues"   Peripheral vascular disease Maternal Grandmother    Coronary artery disease Other    Diabetes Other    Hyperlipidemia Other    Stroke Other     Review of Systems: Constitutional: Doesn't report fevers, chills or abnormal weight loss Eyes: Doesn't report blurriness of vision Ears, nose, mouth, throat, and face: Doesn't report sore throat Respiratory: Doesn't report cough, dyspnea or wheezes Cardiovascular: Doesn't report palpitation, chest discomfort  Gastrointestinal:  Doesn't report nausea, constipation, diarrhea GU: Doesn't report incontinence Skin: Doesn't report skin rashes Neurological: Per HPI Musculoskeletal: Doesn't report joint pain Behavioral/Psych: Doesn't report anxiety  Physical Exam: There were no vitals filed for this visit.  KPS:  90. General: Alert, cooperative, pleasant, in no acute distress Head: Normal EENT: No conjunctival injection or scleral icterus.  Lungs: Resp effort normal Cardiac: Regular rate Abdomen: Non-distended abdomen Skin: No rashes cyanosis or petechiae. Extremities: No clubbing or edema  Neurologic Exam: Mental Status: Awake, alert, attentive to examiner. Oriented to self and environment. Language is fluent with intact comprehension.  Cranial Nerves: Visual acuity is grossly normal. Visual fields are full. Extra-ocular movements intact. No ptosis. Face is symmetric Motor: Tone and bulk are normal. Power is full in both arms and legs. Reflexes are symmetric, no pathologic reflexes present.  Sensory: Intact to light touch Gait: Normal.   Labs: I have reviewed the data as listed    Component Value Date/Time   NA 137 03/07/2022 1218   K 4.2 03/07/2022 1218   CL 103 03/07/2022 1218   CO2 30 03/07/2022 1218   GLUCOSE 116 (H) 03/07/2022 1218   BUN 15 03/07/2022 1218  CREATININE 0.74 03/07/2022 1218   CALCIUM 9.7 03/07/2022 1218   PROT 7.2 03/07/2022 1218   ALBUMIN 4.5 03/07/2022 1218   AST 19 03/07/2022 1218   ALT 25 03/07/2022 1218   ALKPHOS 68 03/07/2022 1218   BILITOT 0.3 03/07/2022 1218   GFRNONAA >60 03/07/2022 1218   Lab Results  Component Value Date   WBC 5.3 03/07/2022   NEUTROABS 3.4 03/07/2022   HGB 13.5 03/07/2022   HCT 37.8 03/07/2022   MCV 87.5 03/07/2022   PLT 185 03/07/2022     Assessment/Plan Astrocytoma ([facility])  Seizures ([place])  AIKA BRZOSKA is clinically stable today, now having completed cycle #6 of 5-day Temodar.  MRI brain demonstrates stable findings.  We recommended continuing treatment with cycle #7 Temozolomide 229m/m2, on for five days and off for twenty three days in twenty eight day cycles. The patient will have a complete blood count performed on days 21 and 28 of each cycle, and a comprehensive metabolic panel performed on day 28 of each cycle.  Labs may need to be performed more often. Zofran will prescribed for home use for nausea/vomiting.   Chemotherapy should be held for the following:  ANC less than 1,000  Platelets less than 100,000  LFT or creatinine greater than 2x ULN  If clinical concerns/contraindications develop  For seizures, she will continue on Keppra 15044mBID and Lamictal 10038maily.  May dose sumatriptan, NSAIDs or Tylenol for breakthrough migraines.  We ask that Stacy JAIMESturn to clinic in 1 months prior to cycle #8 with labs for evaluation, or sooner as needed.  All questions were answered. The patient knows to call the clinic with any problems, questions or concerns. No barriers to learning were detected.  The total time spent in the encounter was 30 minutes and more than 50% was on counseling and review of test results   ZacVentura SellersD Medical Director of Neuro-Oncology [facility] [place]/10/23 2:27 PM

## 2022-05-03 ENCOUNTER — Telehealth: Payer: Self-pay | Admitting: Internal Medicine

## 2022-05-03 ENCOUNTER — Other Ambulatory Visit: Payer: Self-pay | Admitting: *Deleted

## 2022-05-03 DIAGNOSIS — C719 Malignant neoplasm of brain, unspecified: Secondary | ICD-10-CM

## 2022-05-03 NOTE — Progress Notes (Signed)
Placed referral for speech therapy to [facility].    Referral to be faxed to [phone].  Need referral actually signed before faxing.

## 2022-05-03 NOTE — Telephone Encounter (Signed)
Per 7/10 los called and left pt a message about appointment.  Details and call back number were left.

## 2022-05-16 ENCOUNTER — Other Ambulatory Visit: Payer: Self-pay

## 2022-05-18 ENCOUNTER — Other Ambulatory Visit: Payer: Self-pay

## 2022-05-18 DIAGNOSIS — R488 Other symbolic dysfunctions: Secondary | ICD-10-CM | POA: Diagnosis not present

## 2022-05-18 DIAGNOSIS — C719 Malignant neoplasm of brain, unspecified: Secondary | ICD-10-CM | POA: Diagnosis not present

## 2022-05-20 ENCOUNTER — Telehealth: Payer: Self-pay | Admitting: *Deleted

## 2022-05-20 ENCOUNTER — Other Ambulatory Visit (HOSPITAL_COMMUNITY): Payer: Self-pay

## 2022-05-20 NOTE — Telephone Encounter (Signed)
Stacy Gutierrez states she took her Temodar 3 tablets on the first two days. Wants to know if it is OK to take the extra 2 tablets on the sixth day. OK per Dr Mickeal Skinner.  Also states her stomach is hurting more than normal. " I feel like I have a rock in my stomach". States bowels are moving fine. Dr Mickeal Skinner suggest GI rest or brat diet.  Pt notified

## 2022-05-21 ENCOUNTER — Other Ambulatory Visit: Payer: Self-pay

## 2022-06-01 DIAGNOSIS — R488 Other symbolic dysfunctions: Secondary | ICD-10-CM | POA: Diagnosis not present

## 2022-06-01 DIAGNOSIS — C719 Malignant neoplasm of brain, unspecified: Secondary | ICD-10-CM | POA: Diagnosis not present

## 2022-06-06 ENCOUNTER — Ambulatory Visit: Payer: BC Managed Care – PPO | Admitting: Internal Medicine

## 2022-06-06 ENCOUNTER — Other Ambulatory Visit: Payer: BC Managed Care – PPO

## 2022-06-07 ENCOUNTER — Other Ambulatory Visit: Payer: Self-pay

## 2022-06-07 MED ORDER — TEMOZOLOMIDE 100 MG PO CAPS: ORAL_CAPSULE | ORAL | 0 refills | Status: DC

## 2022-06-20 ENCOUNTER — Other Ambulatory Visit: Payer: Self-pay

## 2022-06-20 ENCOUNTER — Inpatient Hospital Stay: Payer: BC Managed Care – PPO | Admitting: Internal Medicine

## 2022-06-20 ENCOUNTER — Inpatient Hospital Stay: Payer: BC Managed Care – PPO | Attending: Internal Medicine

## 2022-06-20 VITALS — BP 137/80 | HR 74 | Temp 98.2°F | Resp 19 | Wt 195.5 lb

## 2022-06-20 DIAGNOSIS — R569 Unspecified convulsions: Secondary | ICD-10-CM | POA: Diagnosis not present

## 2022-06-20 DIAGNOSIS — Z9221 Personal history of antineoplastic chemotherapy: Secondary | ICD-10-CM | POA: Insufficient documentation

## 2022-06-20 DIAGNOSIS — C719 Malignant neoplasm of brain, unspecified: Secondary | ICD-10-CM | POA: Diagnosis not present

## 2022-06-20 DIAGNOSIS — Z923 Personal history of irradiation: Secondary | ICD-10-CM | POA: Diagnosis not present

## 2022-06-20 DIAGNOSIS — C712 Malignant neoplasm of temporal lobe: Secondary | ICD-10-CM | POA: Diagnosis not present

## 2022-06-20 LAB — CBC WITH DIFFERENTIAL (CANCER CENTER ONLY)
Abs Immature Granulocytes: 0.06 10*3/uL (ref 0.00–0.07)
Basophils Absolute: 0 10*3/uL (ref 0.0–0.1)
Basophils Relative: 1 %
Eosinophils Absolute: 0.2 10*3/uL (ref 0.0–0.5)
Eosinophils Relative: 2 %
HCT: 40.1 % (ref 36.0–46.0)
Hemoglobin: 14 g/dL (ref 12.0–15.0)
Immature Granulocytes: 1 %
Lymphocytes Relative: 18 %
Lymphs Abs: 1.2 10*3/uL (ref 0.7–4.0)
MCH: 31 pg (ref 26.0–34.0)
MCHC: 34.9 g/dL (ref 30.0–36.0)
MCV: 88.9 fL (ref 80.0–100.0)
Monocytes Absolute: 0.5 10*3/uL (ref 0.1–1.0)
Monocytes Relative: 7 %
Neutro Abs: 4.7 10*3/uL (ref 1.7–7.7)
Neutrophils Relative %: 71 %
Platelet Count: 236 10*3/uL (ref 150–400)
RBC: 4.51 MIL/uL (ref 3.87–5.11)
RDW: 11.9 % (ref 11.5–15.5)
WBC Count: 6.6 10*3/uL (ref 4.0–10.5)
nRBC: 0 % (ref 0.0–0.2)

## 2022-06-20 LAB — CMP (CANCER CENTER ONLY)
ALT: 29 U/L (ref 0–44)
AST: 21 U/L (ref 15–41)
Albumin: 4.6 g/dL (ref 3.5–5.0)
Alkaline Phosphatase: 69 U/L (ref 38–126)
Anion gap: 4 — ABNORMAL LOW (ref 5–15)
BUN: 14 mg/dL (ref 6–20)
CO2: 28 mmol/L (ref 22–32)
Calcium: 9.6 mg/dL (ref 8.9–10.3)
Chloride: 106 mmol/L (ref 98–111)
Creatinine: 0.64 mg/dL (ref 0.44–1.00)
GFR, Estimated: >60 mL/min (ref >60)
Glucose, Bld: 112 mg/dL — ABNORMAL HIGH (ref 70–99)
Potassium: 3.8 mmol/L (ref 3.5–5.1)
Sodium: 138 mmol/L (ref 135–145)
Total Bilirubin: 0.3 mg/dL (ref 0.3–1.2)
Total Protein: 7.1 g/dL (ref 6.5–8.1)

## 2022-06-20 MED ORDER — TEMOZOLOMIDE 100 MG PO CAPS: 200.0000 mg/m2/d | ORAL_CAPSULE | Freq: Every day | ORAL | 0 refills | Status: AC

## 2022-06-20 NOTE — Progress Notes (Signed)
[place] at [place], [place] [address] [phone]   Interval Evaluation  Date of Service: 06/20/22 Patient Name: Stacy Gutierrez Patient MRN: [id number] Patient DOB: [birth year] Provider: Ventura Sellers, MD  Identifying Statement:  Stacy Gutierrez is a 31 y.o. 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 Tommi Rumps at [facility].  Path demonstrates WHO 2 diffuse astrocytoma, IDH mt   06/12/2021 Progression   Progression of disease #1   07/09/2021 Procedure   Oocyte retrieval at [place], prior to initiation of chemotherapy   07/21/2021 - 08/31/2021 Radiation Therapy   IMRT and concurrent Temodar with Dr. Isidore Moos   10/14/2021 -  Chemotherapy   Patient is on Treatment Plan : BRAIN Low Grade Glioma Grade II, Glioblastoma,  Astrocytoma, Oligodendroma, Recurrent or Progressive / Temozolomide D1-5 Q28 Days      Biomarkers:  MGMT Unknown.  IDH 1/2 Mutated.  EGFR Unknown  TERT Unmutated   Interval History: Stacy Gutierrez presents today Stacy follow up, now having completed cycle #7 of 5-day Temodar, and recent brain MRI.  Today she complains of worsening fatigue.  Otherwise no new or progressive changes reported today.  No further seizures, still on Lamictal 100/100 and Keppra 1500/2000.  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    Stacy Gutierrez, Stacy local radiation and chemotherapy Stacy progressive glioma.  Patient describes breakthrough seizure episode earlier this month, leading to hospitalization at [place] and [facility].  Vimpat was poorly tolerated, so Lamictal titration was initiated.  She is currently up to 48m BID, with plans to increase to 1064mBID. Also on Keppra 1500/2000.  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  [facility] team is recommending radiation and Temodar given recent progression noted on MRI.  No exposure to RT or chemo since diagnosis in 2019.  Medications: Current Outpatient Medications on File Prior to Visit  Medication Sig Dispense Refill   lamoTRIgine (LAMICTAL) 100 MG tablet Take 1 tablet (100 mg total) by mouth 2 (two) times daily. 60 tablet 3   levETIRAcetam (KEPPRA) 1000 MG tablet Take 1 tablet (1,000 mg total) by mouth See admin instructions. 1500 mg by mouth every morning and 2000 mg at bedtime 120 tablet 2   LORazepam (ATIVAN) 1 MG tablet Take 1 tablet (1 mg total) by mouth daily as needed. 30 tablet 0   ondansetron (ZOFRAN) 8 MG tablet Take 1 tablet by mouth 2 times daily as needed (nausea and vomiting). May take 30 - 60 minutes prior to Temodar administration if nausea/vomiting occurs. 30 tablet 1   SUMAtriptan (IMITREX) 100 MG tablet Take 1 tablet by mouth See admin instructions. Take 1 tablet by mouth at onset of headache.  May repeat in 2 hours as needed.  No more than 2 tablets in 24 hours.     temozolomide (TEMODAR) 100 MG capsule TAKE 4 CAPSULES DAILY Stacy 5 DAYS. TAKE ON AN EMPTY STOMACH TO DECREASE NAUSEA AND VOMITING 20 capsule 0   levonorgestrel (MIRENA, 52 MG,) 20 MCG/DAY IUD 1 each by Intrauterine route once Stacy 1 dose. 1 each 0   No current facility-administered medications on file prior  to visit.    Allergies: No Known Allergies Past Medical History:  Past Medical History:  Diagnosis Date   Astrocytoma ([place])    Mononucleosis 01/22/2010   Seizures ([place])    Vasovagal syncope    Past Surgical History:  Past Surgical History:  Procedure Laterality Date   CRANIOTOMY Left    TIBIA FRACTURE SURGERY  03/24/2010   Tibial Fracture-left leg--plate/screw--Dr. Linton Rump   TONSILLECTOMY     TYMPANOSTOMY TUBE PLACEMENT Left 08/26/2021   Social History:  Social History    Socioeconomic History   Marital status: Married    Spouse name: Not on file   Number of children: 0   Years of education: Not on file   Highest education level: Not on file  Occupational History   Occupation: Ship broker  Tobacco Use   Smoking status: Never   Smokeless tobacco: Never  Vaping Use   Vaping Use: Never used  Substance and Sexual Activity   Alcohol use: No   Drug use: Not on file   Sexual activity: Yes    Partners: Male    Birth control/protection: I.U.D.  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   Social Determinants of Health   Financial Resource Strain: Not on file  Food Insecurity: Not on file  Transportation Needs: Not on file  Physical Activity: Not on file  Stress: Not on file  Social Connections: Not on file  Intimate Partner Violence: Not on file   Family History:  Family History  Problem Relation Age of Onset   Bladder Cancer Mother    Diabetes type II Maternal Grandmother        "heart issues"   Peripheral vascular disease Maternal Grandmother    Coronary artery disease Other    Diabetes Other    Hyperlipidemia Other    Stroke Other     Review of Systems: Constitutional: Doesn't report fevers, chills or abnormal weight loss Eyes: Doesn't report blurriness of vision Ears, nose, mouth, throat, and face: Doesn't report sore throat Respiratory: Doesn't report cough, dyspnea or wheezes Cardiovascular: Doesn't report palpitation, chest discomfort  Gastrointestinal:  Doesn't report nausea, constipation, diarrhea GU: Doesn't report incontinence Skin: Doesn't report skin rashes Neurological: Per HPI Musculoskeletal: Doesn't report joint pain Behavioral/Psych: Doesn't report anxiety  Physical Exam: Vitals:   06/20/22 1030  BP: 137/80  Pulse: 74  Resp: 19  Temp: 98.2 F (36.8 C)  SpO2: 100%    KPS: 90. General: Alert,  cooperative, pleasant, in no acute distress Head: Normal EENT: No conjunctival injection or scleral icterus.  Lungs: Resp effort normal Cardiac: Regular rate Abdomen: Non-distended abdomen Skin: No rashes cyanosis or petechiae. Extremities: No clubbing or edema  Neurologic Exam: Mental Status: Awake, alert, attentive to examiner. Oriented to self and environment. Language is fluent with intact comprehension.  Cranial Nerves: Visual acuity is grossly normal. Visual fields are full. Extra-ocular movements intact. No ptosis. Face is symmetric Motor: Tone and bulk are normal. Power is full in both arms and legs. Reflexes are symmetric, no pathologic reflexes present.  Sensory: Intact to light touch Gait: Normal.   Labs: I have reviewed the data as listed    Component Value Date/Time   NA 138 05/02/2022 1416   K 4.1 05/02/2022 1416   CL 103 05/02/2022 1416   CO2 29 05/02/2022 1416   GLUCOSE 84 05/02/2022 1416   BUN 13 05/02/2022  1416   CREATININE 0.82 05/02/2022 1416   CALCIUM 9.8 05/02/2022 1416   PROT 7.4 05/02/2022 1416   ALBUMIN 4.7 05/02/2022 1416   AST 17 05/02/2022 1416   ALT 21 05/02/2022 1416   ALKPHOS 63 05/02/2022 1416   BILITOT 0.3 05/02/2022 1416   GFRNONAA >60 05/02/2022 1416   Lab Results  Component Value Date   WBC 6.6 06/20/2022   NEUTROABS 4.7 06/20/2022   HGB 14.0 06/20/2022   HCT 40.1 06/20/2022   MCV 88.9 06/20/2022   PLT 236 06/20/2022     Assessment/Plan Astrocytoma ([facility])  Seizures ([place])  OLISA QUESNEL is clinically stable today, now having completed cycle #7 of 5-day Temodar.  MRI brain demonstrates stable findings.  We recommended continuing treatment with cycle #8 Temozolomide 274m/m2, on Stacy five days and off Stacy twenty three days in twenty eight day cycles. The patient will have a complete blood count performed on days 21 and 28 of each cycle, and a comprehensive metabolic panel performed on day 28 of each cycle. Labs may need to be  performed more often. Zofran will prescribed Stacy home use Stacy nausea/vomiting.   Chemotherapy should be held Stacy the following:  ANC less than 1,000  Platelets less than 100,000  LFT or creatinine greater than 2x ULN  If clinical concerns/contraindications develop  Stacy seizures, will decrease Keppra to 100/1500 and con't Lamictal 1086mBID.  May dose sumatriptan, NSAIDs or Tylenol Stacy breakthrough migraines.  We ask that AlFAYNE MCGUFFEEeturn to clinic in 1 months prior to cycle #8 with MRI  brain Stacy evaluation, or sooner as needed.  All questions were answered. The patient knows to call the clinic with any problems, questions or concerns. No barriers to learning were detected.  The total time spent in the encounter was 30 minutes and more than 50% was on counseling and review of test results   ZaVentura SellersMD Medical Director of Neuro-Oncology [facility] [place]8/28/23 10:33 AM

## 2022-06-21 ENCOUNTER — Telehealth: Payer: Self-pay | Admitting: Internal Medicine

## 2022-06-21 NOTE — Telephone Encounter (Signed)
Per 8/28 los called and left message for pt about appointment  

## 2022-06-22 ENCOUNTER — Other Ambulatory Visit: Payer: Self-pay

## 2022-06-22 DIAGNOSIS — R488 Other symbolic dysfunctions: Secondary | ICD-10-CM | POA: Diagnosis not present

## 2022-06-22 DIAGNOSIS — C719 Malignant neoplasm of brain, unspecified: Secondary | ICD-10-CM | POA: Diagnosis not present

## 2022-06-23 ENCOUNTER — Encounter: Payer: Self-pay | Admitting: Internal Medicine

## 2022-07-04 ENCOUNTER — Other Ambulatory Visit: Payer: Self-pay | Admitting: Internal Medicine

## 2022-07-04 ENCOUNTER — Other Ambulatory Visit: Payer: Self-pay | Admitting: Radiation Therapy

## 2022-07-04 NOTE — Telephone Encounter (Signed)
Patient will have labs and md visit prior to reordering Temodar.

## 2022-07-05 ENCOUNTER — Other Ambulatory Visit: Payer: Self-pay

## 2022-07-15 ENCOUNTER — Ambulatory Visit (HOSPITAL_COMMUNITY)
Admission: RE | Admit: 2022-07-15 | Discharge: 2022-07-15 | Disposition: A | Discharge status: Home / Self Care | Payer: BC Managed Care – PPO | Source: Ambulatory Visit | Attending: Internal Medicine | Admitting: Internal Medicine

## 2022-07-15 DIAGNOSIS — C719 Malignant neoplasm of brain, unspecified: Secondary | ICD-10-CM | POA: Diagnosis not present

## 2022-07-15 MED ORDER — GADOBUTROL 1 MMOL/ML IV SOLN
9.0000 mL | Freq: Once | INTRAVENOUS | Status: AC | PRN
  Administered 2022-07-15: 9 mL via INTRAVENOUS

## 2022-07-18 ENCOUNTER — Encounter: Payer: Self-pay | Admitting: Internal Medicine

## 2022-07-18 ENCOUNTER — Inpatient Hospital Stay: Payer: BC Managed Care – PPO | Attending: Internal Medicine | Admitting: Internal Medicine

## 2022-07-18 ENCOUNTER — Other Ambulatory Visit: Payer: Self-pay | Admitting: *Deleted

## 2022-07-18 ENCOUNTER — Inpatient Hospital Stay: Payer: BC Managed Care – PPO

## 2022-07-18 VITALS — BP 126/81 | HR 83 | Temp 97.7°F | Resp 16 | Ht 65.0 in | Wt 197.9 lb

## 2022-07-18 DIAGNOSIS — Z79899 Other long term (current) drug therapy: Secondary | ICD-10-CM | POA: Insufficient documentation

## 2022-07-18 DIAGNOSIS — C719 Malignant neoplasm of brain, unspecified: Secondary | ICD-10-CM

## 2022-07-18 DIAGNOSIS — R569 Unspecified convulsions: Secondary | ICD-10-CM

## 2022-07-18 DIAGNOSIS — C712 Malignant neoplasm of temporal lobe: Secondary | ICD-10-CM | POA: Insufficient documentation

## 2022-07-18 DIAGNOSIS — G43909 Migraine, unspecified, not intractable, without status migrainosus: Secondary | ICD-10-CM | POA: Diagnosis not present

## 2022-07-18 LAB — CMP (CANCER CENTER ONLY)
ALT: 32 U/L (ref 0–44)
AST: 21 U/L (ref 15–41)
Albumin: 4.5 g/dL (ref 3.5–5.0)
Alkaline Phosphatase: 59 U/L (ref 38–126)
Anion gap: 4 — ABNORMAL LOW (ref 5–15)
BUN: 16 mg/dL (ref 6–20)
CO2: 30 mmol/L (ref 22–32)
Calcium: 9.2 mg/dL (ref 8.9–10.3)
Chloride: 105 mmol/L (ref 98–111)
Creatinine: 0.81 mg/dL (ref 0.44–1.00)
GFR, Estimated: >60 mL/min (ref >60)
Glucose, Bld: 87 mg/dL (ref 70–99)
Potassium: 3.7 mmol/L (ref 3.5–5.1)
Sodium: 139 mmol/L (ref 135–145)
Total Bilirubin: 0.4 mg/dL (ref 0.3–1.2)
Total Protein: 7.3 g/dL (ref 6.5–8.1)

## 2022-07-18 LAB — CBC WITH DIFFERENTIAL (CANCER CENTER ONLY)
Abs Immature Granulocytes: 0.02 10*3/uL (ref 0.00–0.07)
Basophils Absolute: 0 10*3/uL (ref 0.0–0.1)
Basophils Relative: 1 %
Eosinophils Absolute: 0.1 10*3/uL (ref 0.0–0.5)
Eosinophils Relative: 2 %
HCT: 39.5 % (ref 36.0–46.0)
Hemoglobin: 13.9 g/dL (ref 12.0–15.0)
Immature Granulocytes: 0 %
Lymphocytes Relative: 19 %
Lymphs Abs: 1.1 10*3/uL (ref 0.7–4.0)
MCH: 31.2 pg (ref 26.0–34.0)
MCHC: 35.2 g/dL (ref 30.0–36.0)
MCV: 88.8 fL (ref 80.0–100.0)
Monocytes Absolute: 0.6 10*3/uL (ref 0.1–1.0)
Monocytes Relative: 10 %
Neutro Abs: 3.9 10*3/uL (ref 1.7–7.7)
Neutrophils Relative %: 68 %
Platelet Count: 252 10*3/uL (ref 150–400)
RBC: 4.45 MIL/uL (ref 3.87–5.11)
RDW: 11.6 % (ref 11.5–15.5)
WBC Count: 5.8 10*3/uL (ref 4.0–10.5)
nRBC: 0 % (ref 0.0–0.2)

## 2022-07-18 MED ORDER — TEMOZOLOMIDE 100 MG PO CAPS: 200.0000 mg/m2/d | ORAL_CAPSULE | Freq: Every day | ORAL | 0 refills | Status: AC

## 2022-07-18 NOTE — Progress Notes (Signed)
[place] at [place],  [address] [phone]   Interval Evaluation  Date of Service: 07/18/22 Patient Name: Stacy Gutierrez Patient MRN: [id number] Patient DOB: 10-24-1991 Provider: Ventura Sellers, MD  Identifying Statement:  Stacy Gutierrez is a 31 y.o. 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 Tommi Rumps at [facility].  Path demonstrates WHO 2 diffuse astrocytoma, IDH mt   06/12/2021 Progression   Progression of disease #1   07/09/2021 Procedure   Oocyte retrieval at [place], prior to initiation of chemotherapy   07/21/2021 - 08/31/2021 Radiation Therapy   IMRT and concurrent Temodar with Dr. Isidore Moos   10/14/2021 -  Chemotherapy   Patient is on Treatment Plan : BRAIN Low Grade Glioma Grade II, Glioblastoma,  Astrocytoma, Oligodendroma, Recurrent or Progressive / Temozolomide D1-5 Q28 Days      Biomarkers:  MGMT Unknown.  IDH 1/2 Mutated.  EGFR Unknown  TERT Unmutated   Interval History: Stacy Gutierrez presents today for follow up, now having completed cycle #8 of 5-day Temodar, and recent brain MRI.  Still complains of fatigue, maybe a little better than last month with the drop in Keppra dose.  Otherwise no new or progressive changes reported today.  No further seizures, still on Lamictal 100/100 and Keppra 1500/1000.  Recently moved into new home.  H+P (06/22/21) Patient presents today as referral from Dr. Ferdinand Lango at [facility], for local radiation and chemotherapy for progressive glioma.  Patient describes breakthrough seizure episode earlier this month, leading to hospitalization at [place] [facility].  Vimpat was poorly tolerated, so Lamictal titration was initiated.  She is currently up to 24m BID, with plans to increase to 1054mBID. Also on Keppra 1500/2000.  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  [facility] team is recommending radiation and Temodar given recent progression noted on MRI.  No exposure to RT or chemo since diagnosis in 2019.  Medications: Current Outpatient Medications on File Prior to Visit  Medication Sig Dispense Refill   lamoTRIgine (LAMICTAL) 100 MG tablet Take 1 tablet (100 mg total) by mouth 2 (two) times daily. 60 tablet 3   levETIRAcetam (KEPPRA) 1000 MG tablet Take 1 tablet (1,000 mg total) by mouth See admin instructions. 1500 mg by mouth every morning and 2000 mg at bedtime 120 tablet 2   levonorgestrel (MIRENA, 52 MG,) 20 MCG/DAY IUD 1 each by Intrauterine route once for 1 dose. 1 each 0   LORazepam (ATIVAN) 1 MG tablet Take 1 tablet (1 mg total) by mouth daily as needed. 30 tablet 0   ondansetron (ZOFRAN) 8 MG tablet Take 1 tablet by mouth 2 times daily as needed (nausea and vomiting). May take 30 - 60 minutes prior to Temodar administration if nausea/vomiting occurs. 30 tablet 1   SUMAtriptan (IMITREX) 100 MG tablet Take 1 tablet by mouth See admin instructions. Take 1 tablet by mouth at onset of headache.  May repeat in 2 hours as needed.  No more than 2 tablets in 24 hours.     temozolomide (TEMODAR) 100 MG capsule TAKE 4 CAPSULES DAILY FOR 5 DAYS. TAKE ON AN EMPTY STOMACH TO DECREASE NAUSEA AND VOMITING 20 capsule 0   No current facility-administered medications  on file prior to visit.    Allergies: No Known Allergies Past Medical History:  Past Medical History:  Diagnosis Date   Astrocytoma ([place])    Mononucleosis 01/22/2010   Seizures ([place])    Vasovagal syncope    Past Surgical History:  Past Surgical History:  Procedure Laterality Date   CRANIOTOMY Left    TIBIA FRACTURE SURGERY  03/24/2010   Tibial Fracture-left leg--plate/screw--Dr. Linton Rump   TONSILLECTOMY     TYMPANOSTOMY TUBE PLACEMENT Left 08/26/2021   Social History:  Social  History   Socioeconomic History   Marital status: Married    Spouse name: Not on file   Number of children: 0   Years of education: Not on file   Highest education level: Not on file  Occupational History   Occupation: Ship broker  Tobacco Use   Smoking status: Never   Smokeless tobacco: Never  Vaping Use   Vaping Use: Never used  Substance and Sexual Activity   Alcohol use: No   Drug use: Not on file   Sexual activity: Yes    Partners: Male    Birth control/protection: I.U.D.  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   Social Determinants of Health   Financial Resource Strain: Not on file  Food Insecurity: Not on file  Transportation Needs: Not on file  Physical Activity: Not on file  Stress: Not on file  Social Connections: Not on file  Intimate Partner Violence: Not on file   Family History:  Family History  Problem Relation Age of Onset   Bladder Cancer Mother    Diabetes type II Maternal Grandmother        "heart issues"   Peripheral vascular disease Maternal Grandmother    Coronary artery disease Other    Diabetes Other    Hyperlipidemia Other    Stroke Other     Review of Systems: Constitutional: Doesn't report fevers, chills or abnormal weight loss Eyes: Doesn't report blurriness of vision Ears, nose, mouth, throat, and face: Doesn't report sore throat Respiratory: Doesn't report cough, dyspnea or wheezes Cardiovascular: Doesn't report palpitation, chest discomfort  Gastrointestinal:  Doesn't report nausea, constipation, diarrhea GU: Doesn't report incontinence Skin: Doesn't report skin rashes Neurological: Per HPI Musculoskeletal: Doesn't report joint pain Behavioral/Psych: Doesn't report anxiety  Physical Exam: Vitals:   07/18/22 1142  BP: 126/81  Pulse: 83  Resp: 16  Temp: 97.7 F (36.5 C)  SpO2: 96%   KPS: 90. General: Alert,  cooperative, pleasant, in no acute distress Head: Normal EENT: No conjunctival injection or scleral icterus.  Lungs: Resp effort normal Cardiac: Regular rate Abdomen: Non-distended abdomen Skin: No rashes cyanosis or petechiae. Extremities: No clubbing or edema  Neurologic Exam: Mental Status: Awake, alert, attentive to examiner. Oriented to self and environment. Language is fluent with intact comprehension.  Cranial Nerves: Visual acuity is grossly normal. Visual fields are full. Extra-ocular movements intact. No ptosis. Face is symmetric Motor: Tone and bulk are normal. Power is full in both arms and legs. Reflexes are symmetric, no pathologic reflexes present.  Sensory: Intact to light touch Gait: Normal.   Labs: I have reviewed the data as listed    Component Value Date/Time   NA 139 07/18/2022 1228   K 3.7 07/18/2022 1228   CL 105 07/18/2022 1228   CO2 30 07/18/2022 1228   GLUCOSE 87 07/18/2022 1228   BUN  16 07/18/2022 1228   CREATININE 0.81 07/18/2022 1228   CALCIUM 9.2 07/18/2022 1228   PROT 7.3 07/18/2022 1228   ALBUMIN 4.5 07/18/2022 1228   AST 21 07/18/2022 1228   ALT 32 07/18/2022 1228   ALKPHOS 59 07/18/2022 1228   BILITOT 0.4 07/18/2022 1228   GFRNONAA >60 07/18/2022 1228   Lab Results  Component Value Date   WBC 5.8 07/18/2022   NEUTROABS 3.9 07/18/2022   HGB 13.9 07/18/2022   HCT 39.5 07/18/2022   MCV 88.8 07/18/2022   PLT 252 07/18/2022    Imaging:  [place] Clinician Interpretation: I have personally reviewed the CNS images as listed.  My interpretation, in the context of the patient's clinical presentation, is treatment effect vs true progression  MR BRAIN W WO CONTRAST  Result Date: 07/16/2022 CLINICAL DATA:  Brain/CNS neoplasm. Evaluate treatment response. Diffuse astrocytoma status post craniotomy with resection. Ongoing chemotherapy and radiation. EXAM: MRI HEAD WITHOUT AND WITH CONTRAST TECHNIQUE: Multiplanar, multiecho pulse sequences of the  brain and surrounding structures were obtained without and with intravenous contrast. CONTRAST:  34m GADAVIST GADOBUTROL 1 MMOL/ML IV SOLN COMPARISON:  MR head without and with contrast 04/12/2022 at [facility] MR head without and with contrast 05/30/2022 at [facility] FINDINGS: Brain: Left frontal craniotomy again noted. The resection cavity is stable. The surrounding T2 and FLAIR signal changes have progressed both superiorly and inferiorly. Progressive involvement of the inferior left frontal lobe and temporal tip noted. A punctate focus of enhancement is now present medial to the resection cavity. No distal enhancement is present. The ventricles are of normal size. No significant extraaxial fluid collection is present. The internal auditory canals are within normal limits. The brainstem and cerebellum are within normal limits. Vascular: Flow is present in the major intracranial arteries. Skull and upper cervical spine: The craniocervical junction is normal. Upper cervical spine is within normal limits. Marrow signal is unremarkable. Sinuses/Orbits: The paranasal sinuses and mastoid air cells are clear. The globes and orbits are within normal limits. IMPRESSION: 1. Progressive T2 and FLAIR signal changes both superiorly and inferiorly in the left frontal lobe and temporal tip consistent with progression of tumor. 2. New punctate focus of enhancement medial to the resection cavity also suggest progression. Electronically Signed   By: CSan MorelleM.D.   On: 07/16/2022 19:53     Assessment/Plan Astrocytoma ([place]  Se[place] ([place]  AARMONI KLUDTis clinically stable today, now having completed cycle #8 of 5-day Temodar.  MRI brain demonstrates stable burden of T2/FLAIR signal abnormality, per my and tumor board review.  There is a small focus of enhancement adjacent to the resection cavity which we will continue to monitor.  We recommended continuing treatment with cycle #9  Temozolomide 2084mm2, on for five days and off for twenty three days in twenty eight day cycles. The patient will have a complete blood count performed on days 21 and 28 of each cycle, and a comprehensive metabolic panel performed on day 28 of each cycle. Labs may need to be performed more often. Zofran will prescribed for home use for nausea/vomiting.   Chemotherapy should be held for the following:  ANC less than 1,000  Platelets less than 100,000  LFT or creatinine greater than 2x ULN  If clinical concerns/contraindications develop  For seizures, will decrease Keppra to 100/1500 and con't Lamictal 10030mID.  May dose sumatriptan, NSAIDs or Tylenol for breakthrough migraines.  We ask that Stacy GUERRIERIturn to clinic in 1  months prior to cycle #10 with labs for evaluation, or sooner as needed.  All questions were answered. The patient knows to call the clinic with any problems, questions or concerns. No barriers to learning were detected.  The total time spent in the encounter was 30 minutes and more than 50% was on counseling and review of test results   Ventura Sellers, MD Medical Director of Neuro-Oncology [facility] at [place] 07/18/22 11:37 AM

## 2022-07-19 ENCOUNTER — Other Ambulatory Visit: Payer: Self-pay | Admitting: Internal Medicine

## 2022-07-19 ENCOUNTER — Encounter: Payer: Self-pay | Admitting: Internal Medicine

## 2022-07-19 DIAGNOSIS — C719 Malignant neoplasm of brain, unspecified: Secondary | ICD-10-CM

## 2022-07-20 ENCOUNTER — Other Ambulatory Visit: Payer: Self-pay

## 2022-07-20 ENCOUNTER — Other Ambulatory Visit: Payer: Self-pay | Admitting: Radiation Therapy

## 2022-07-21 ENCOUNTER — Other Ambulatory Visit: Payer: Self-pay | Admitting: *Deleted

## 2022-07-21 ENCOUNTER — Other Ambulatory Visit: Payer: Self-pay

## 2022-07-21 DIAGNOSIS — C719 Malignant neoplasm of brain, unspecified: Secondary | ICD-10-CM

## 2022-07-22 ENCOUNTER — Other Ambulatory Visit: Payer: Self-pay

## 2022-08-01 ENCOUNTER — Other Ambulatory Visit: Payer: Self-pay | Admitting: Internal Medicine

## 2022-08-15 ENCOUNTER — Ambulatory Visit: Payer: BC Managed Care – PPO | Admitting: Internal Medicine

## 2022-08-15 ENCOUNTER — Other Ambulatory Visit: Payer: BC Managed Care – PPO

## 2022-08-22 ENCOUNTER — Inpatient Hospital Stay: Payer: BC Managed Care – PPO | Attending: Internal Medicine

## 2022-08-22 ENCOUNTER — Encounter: Payer: Self-pay | Admitting: Internal Medicine

## 2022-08-22 ENCOUNTER — Other Ambulatory Visit: Payer: Self-pay

## 2022-08-22 ENCOUNTER — Inpatient Hospital Stay (HOSPITAL_BASED_OUTPATIENT_CLINIC_OR_DEPARTMENT_OTHER): Payer: BC Managed Care – PPO | Admitting: Internal Medicine

## 2022-08-22 ENCOUNTER — Other Ambulatory Visit: Payer: Self-pay | Admitting: Internal Medicine

## 2022-08-22 VITALS — BP 145/92 | HR 67 | Temp 98.1°F | Resp 16 | Wt 198.7 lb

## 2022-08-22 DIAGNOSIS — Z9221 Personal history of antineoplastic chemotherapy: Secondary | ICD-10-CM | POA: Diagnosis not present

## 2022-08-22 DIAGNOSIS — C712 Malignant neoplasm of temporal lobe: Secondary | ICD-10-CM | POA: Insufficient documentation

## 2022-08-22 DIAGNOSIS — R569 Unspecified convulsions: Secondary | ICD-10-CM

## 2022-08-22 DIAGNOSIS — Z79899 Other long term (current) drug therapy: Secondary | ICD-10-CM | POA: Diagnosis not present

## 2022-08-22 DIAGNOSIS — G43909 Migraine, unspecified, not intractable, without status migrainosus: Secondary | ICD-10-CM | POA: Insufficient documentation

## 2022-08-22 DIAGNOSIS — C719 Malignant neoplasm of brain, unspecified: Secondary | ICD-10-CM | POA: Diagnosis not present

## 2022-08-22 DIAGNOSIS — R5383 Other fatigue: Secondary | ICD-10-CM | POA: Diagnosis not present

## 2022-08-22 DIAGNOSIS — Z923 Personal history of irradiation: Secondary | ICD-10-CM | POA: Insufficient documentation

## 2022-08-22 LAB — CBC WITH DIFFERENTIAL (CANCER CENTER ONLY)
Abs Immature Granulocytes: 0.02 10*3/uL (ref 0.00–0.07)
Basophils Absolute: 0 10*3/uL (ref 0.0–0.1)
Basophils Relative: 1 %
Eosinophils Absolute: 0.2 10*3/uL (ref 0.0–0.5)
Eosinophils Relative: 3 %
HCT: 40.4 % (ref 36.0–46.0)
Hemoglobin: 14.2 g/dL (ref 12.0–15.0)
Immature Granulocytes: 0 %
Lymphocytes Relative: 23 %
Lymphs Abs: 1.3 10*3/uL (ref 0.7–4.0)
MCH: 31.6 pg (ref 26.0–34.0)
MCHC: 35.1 g/dL (ref 30.0–36.0)
MCV: 90 fL (ref 80.0–100.0)
Monocytes Absolute: 0.5 10*3/uL (ref 0.1–1.0)
Monocytes Relative: 9 %
Neutro Abs: 3.8 10*3/uL (ref 1.7–7.7)
Neutrophils Relative %: 64 %
Platelet Count: 205 10*3/uL (ref 150–400)
RBC: 4.49 MIL/uL (ref 3.87–5.11)
RDW: 11.7 % (ref 11.5–15.5)
WBC Count: 5.8 10*3/uL (ref 4.0–10.5)
nRBC: 0 % (ref 0.0–0.2)

## 2022-08-22 LAB — CMP (CANCER CENTER ONLY)
ALT: 35 U/L (ref 0–44)
AST: 20 U/L (ref 15–41)
Albumin: 4.7 g/dL (ref 3.5–5.0)
Alkaline Phosphatase: 68 U/L (ref 38–126)
Anion gap: 7 (ref 5–15)
BUN: 13 mg/dL (ref 6–20)
CO2: 30 mmol/L (ref 22–32)
Calcium: 9.8 mg/dL (ref 8.9–10.3)
Chloride: 101 mmol/L (ref 98–111)
Creatinine: 0.78 mg/dL (ref 0.44–1.00)
GFR, Estimated: >60 mL/min (ref >60)
Glucose, Bld: 85 mg/dL (ref 70–99)
Potassium: 4.2 mmol/L (ref 3.5–5.1)
Sodium: 138 mmol/L (ref 135–145)
Total Bilirubin: 0.5 mg/dL (ref 0.3–1.2)
Total Protein: 7.8 g/dL (ref 6.5–8.1)

## 2022-08-22 MED ORDER — LEVETIRACETAM 1000 MG PO TABS: ORAL_TABLET | ORAL | 2 refills | Status: DC

## 2022-08-22 NOTE — Progress Notes (Signed)
[facility] [place], [place] [address] [phone]   Interval Evaluation  Date of Service: 08/22/22 Patient Name: Stacy Gutierrez Patient MRN: [id number] Patient DOB: August 11, 1991 Provider: Ventura Sellers, MD  Identifying Statement:  Stacy Gutierrez is a 31 y.o. 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 Tommi Rumps at [facility].  Path demonstrates WHO 2 diffuse astrocytoma, IDH mt   06/12/2021 Progression   Progression of disease #1   07/09/2021 Procedure   Oocyte retrieval at [place] [place], prior to initiation of chemotherapy   07/21/2021 - 08/31/2021 Radiation Therapy   IMRT and concurrent Temodar with Dr. Isidore Moos   10/14/2021 -  Chemotherapy   Patient is on Treatment Plan : BRAIN Low Grade Glioma Grade II, Glioblastoma,  Astrocytoma, Oligodendroma, Recurrent or Progressive / Temozolomide D1-5 Q28 Days      Biomarkers:  MGMT Unknown.  IDH 1/2 Mutated.  EGFR Unknown  TERT Unmutated   Interval History: Stacy Gutierrez presents today for follow up, now having completed cycle #9 of 5-day Temodar.  No new clinical complaints today. No further seizures, still on Lamictal 100/100 and Keppra 1000/1000.  Fatigue still present.  H+P (06/22/21) Patient presents today as referral from Dr. Ferdinand Lango at [facility], for local radiation and chemotherapy for progressive glioma.  Patient describes breakthrough seizure episode earlier this month, leading to [place] [facility].  Vimpat was poorly tolerated, so Lamictal titration was initiated.  She is currently up to 61m BID, with plans to increase to 1077mBID. Also on Keppra 1500/2000.  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  [facility] team is recommending radiation  and Temodar given recent progression noted on MRI.  No exposure to RT or chemo since diagnosis in 2019.  Medications: Current Outpatient Medications on File Prior to Visit  Medication Sig Dispense Refill   lamoTRIgine (LAMICTAL) 100 MG tablet Take 1 tablet (100 mg total) by mouth 2 (two) times daily. 60 tablet 3   levETIRAcetam (KEPPRA) 1000 MG tablet Take 1 tablet (1,000 mg total) by mouth See admin instructions. 1500 mg by mouth every morning and 2000 mg at bedtime (Patient taking differently: Take 1,000 mg by mouth See admin instructions. 1000 mg BID) 120 tablet 2   LORazepam (ATIVAN) 1 MG tablet Take 1 tablet (1 mg total) by mouth daily as needed. 30 tablet 0   ondansetron (ZOFRAN) 8 MG tablet Take 1 tablet by mouth 2 times daily as needed (nausea and vomiting). May take 30 - 60 minutes prior to Temodar administration if nausea/vomiting occurs. 30 tablet 1   levonorgestrel (MIRENA, 52 MG,) 20 MCG/DAY IUD 1 each by Intrauterine route once for 1 dose. 1 each 0   SUMAtriptan (IMITREX) 100 MG tablet Take 1 tablet by mouth See admin instructions. Take 1 tablet by mouth at onset of headache.  May repeat in 2 hours as needed.  No more than 2 tablets in 24 hours. (Patient not taking: Reported on 08/22/2022)     temozolomide (TEMODAR) 100 MG capsule TAKE 4 CAPSULES (400 MG TOTAL) DAILY FOR 5 DAYS. MAY TAKE ON AN EMPTY STOMACH TO DECREASE NAUSEA & VOMITING. (Patient not taking: Reported on 08/22/2022) 20 capsule 0   No current  facility-administered medications on file prior to visit.    Allergies: No Known Allergies Past Medical History:  Past Medical History:  Diagnosis Date   Astrocytoma ([place])    Mononucleosis 01/22/2010   Seizures ([place])    Vasovagal syncope    Past Surgical History:  Past Surgical History:  Procedure Laterality Date   CRANIOTOMY Left    TIBIA FRACTURE SURGERY  03/24/2010   Tibial Fracture-left leg--plate/screw--Dr. Linton Rump   TONSILLECTOMY     TYMPANOSTOMY TUBE PLACEMENT  Left 08/26/2021   Social History:  Social History   Socioeconomic History   Marital status: Married    Spouse name: Not on file   Number of children: 0   Years of education: Not on file   Highest education level: Not on file  Occupational History   Occupation: Ship broker  Tobacco Use   Smoking status: Never   Smokeless tobacco: Never  Vaping Use   Vaping Use: Never used  Substance and Sexual Activity   Alcohol use: No   Drug use: Not on file   Sexual activity: Yes    Partners: Male    Birth control/protection: I.U.D.  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   Social Determinants of Health   Financial Resource Strain: Not on file  Food Insecurity: Not on file  Transportation Needs: Not on file  Physical Activity: Not on file  Stress: Not on file  Social Connections: Not on file  Intimate Partner Violence: Not on file   Family History:  Family History  Problem Relation Age of Onset   Bladder Cancer Mother    Diabetes type II Maternal Grandmother        "heart issues"   Peripheral vascular disease Maternal Grandmother    Coronary artery disease Other    Diabetes Other    Hyperlipidemia Other    Stroke Other     Review of Systems: Constitutional: Doesn't report fevers, chills or abnormal weight loss Eyes: Doesn't report blurriness of vision Ears, nose, mouth, throat, and face: Doesn't report sore throat Respiratory: Doesn't report cough, dyspnea or wheezes Cardiovascular: Doesn't report palpitation, chest discomfort  Gastrointestinal:  Doesn't report nausea, constipation, diarrhea GU: Doesn't report incontinence Skin: Doesn't report skin rashes Neurological: Per HPI Musculoskeletal: Doesn't report joint pain Behavioral/Psych: Doesn't report anxiety  Physical Exam: Vitals:   08/22/22 1115  BP: (!) 145/92  Pulse: 67  Resp: 16  Temp: 98.1 F (36.7  C)  SpO2: 98%   KPS: 90. General: Alert, cooperative, pleasant, in no acute distress Head: Normal EENT: No conjunctival injection or scleral icterus.  Lungs: Resp effort normal Cardiac: Regular rate Abdomen: Non-distended abdomen Skin: No rashes cyanosis or petechiae. Extremities: No clubbing or edema  Neurologic Exam: Mental Status: Awake, alert, attentive to examiner. Oriented to self and environment. Language is fluent with intact comprehension.  Cranial Nerves: Visual acuity is grossly normal. Visual fields are full. Extra-ocular movements intact. No ptosis. Face is symmetric Motor: Tone and bulk are normal. Power is full in both arms and legs. Reflexes are symmetric, no pathologic reflexes present.  Sensory: Intact to light touch Gait: Normal.   Labs: I have reviewed the data as listed    Component Value Date/Time   NA 139 07/18/2022 1228   K 3.7 07/18/2022 1228   CL 105 07/18/2022 1228   CO2 30 07/18/2022 1228   GLUCOSE 87 07/18/2022 1228  BUN 16 07/18/2022 1228   CREATININE 0.81 07/18/2022 1228   CALCIUM 9.2 07/18/2022 1228   PROT 7.3 07/18/2022 1228   ALBUMIN 4.5 07/18/2022 1228   AST 21 07/18/2022 1228   ALT 32 07/18/2022 1228   ALKPHOS 59 07/18/2022 1228   BILITOT 0.4 07/18/2022 1228   GFRNONAA >60 07/18/2022 1228   Lab Results  Component Value Date   WBC 5.8 08/22/2022   NEUTROABS 3.8 08/22/2022   HGB 14.2 08/22/2022   HCT 40.4 08/22/2022   MCV 90.0 08/22/2022   PLT 205 08/22/2022    Assessment/Plan Astrocytoma ([facility])  Seizures ([place])  Stacy Gutierrez is clinically stable today, now having completed cycle #9 of 5-day Temodar.  Labs are within normal limits.  We recommended continuing treatment with cycle #10 Temozolomide 262m/m2, on for five days and off for twenty three days in twenty eight day cycles. The patient will have a complete blood count performed on days 21 and 28 of each cycle, and a comprehensive metabolic panel performed on day 28 of  each cycle. Labs may need to be performed more often. Zofran will prescribed for home use for nausea/vomiting.   Chemotherapy should be held for the following:  ANC less than 1,000  Platelets less than 100,000  LFT or creatinine greater than 2x ULN  If clinical concerns/contraindications develop  For seizures, will con't Keppra 1000/1500 and con't Lamictal 1056mBID.  May dose sumatriptan, NSAIDs or Tylenol for breakthrough migraines.  We ask that AlNOELANI HARBACHeturn to clinic in 1 months prior to cycle #11 with MRI brain for evaluation, or sooner as needed.  All questions were answered. The patient knows to call the clinic with any problems, questions or concerns. No barriers to learning were detected.  The total time spent in the encounter was 30 minutes and more than 50% was on counseling and review of test results   ZaVentura SellersMD Medical Director of Neuro-Oncology [facility]0/30/23 11:28 AM

## 2022-08-30 ENCOUNTER — Other Ambulatory Visit: Payer: Self-pay | Admitting: Internal Medicine

## 2022-08-30 NOTE — Telephone Encounter (Signed)
Patient will have labs and see provider prior to refilling Rx.  She is already scheduled for this.

## 2022-09-01 ENCOUNTER — Encounter: Payer: Self-pay | Admitting: Internal Medicine

## 2022-09-01 DIAGNOSIS — C719 Malignant neoplasm of brain, unspecified: Secondary | ICD-10-CM

## 2022-09-01 MED ORDER — ONDANSETRON HCL 8 MG PO TABS: 8.0000 mg | ORAL_TABLET | Freq: Two times a day (BID) | ORAL | 1 refills | Status: DC | PRN

## 2022-09-01 MED ORDER — LEVETIRACETAM 1000 MG PO TABS: ORAL_TABLET | ORAL | 2 refills | Status: DC

## 2022-09-01 MED ORDER — LAMOTRIGINE 100 MG PO TABS: 100.0000 mg | ORAL_TABLET | Freq: Two times a day (BID) | ORAL | 3 refills | Status: DC

## 2022-09-08 ENCOUNTER — Other Ambulatory Visit (HOSPITAL_COMMUNITY): Payer: Self-pay

## 2022-09-09 ENCOUNTER — Ambulatory Visit (HOSPITAL_COMMUNITY)
Admission: RE | Admit: 2022-09-09 | Discharge: 2022-09-09 | Disposition: A | Discharge status: Home / Self Care | Payer: BC Managed Care – PPO | Source: Ambulatory Visit | Attending: Internal Medicine | Admitting: Internal Medicine

## 2022-09-09 DIAGNOSIS — R220 Localized swelling, mass and lump, head: Secondary | ICD-10-CM | POA: Diagnosis not present

## 2022-09-09 DIAGNOSIS — C719 Malignant neoplasm of brain, unspecified: Secondary | ICD-10-CM | POA: Diagnosis not present

## 2022-09-09 MED ORDER — GADOBUTROL 1 MMOL/ML IV SOLN
9.0000 mL | Freq: Once | INTRAVENOUS | Status: AC | PRN
  Administered 2022-09-09: 9 mL via INTRAVENOUS

## 2022-09-12 ENCOUNTER — Inpatient Hospital Stay: Payer: BC Managed Care – PPO | Attending: Internal Medicine

## 2022-09-12 DIAGNOSIS — Z79899 Other long term (current) drug therapy: Secondary | ICD-10-CM | POA: Insufficient documentation

## 2022-09-12 DIAGNOSIS — Z923 Personal history of irradiation: Secondary | ICD-10-CM | POA: Insufficient documentation

## 2022-09-12 DIAGNOSIS — G43909 Migraine, unspecified, not intractable, without status migrainosus: Secondary | ICD-10-CM | POA: Insufficient documentation

## 2022-09-12 DIAGNOSIS — R5383 Other fatigue: Secondary | ICD-10-CM | POA: Insufficient documentation

## 2022-09-12 DIAGNOSIS — C712 Malignant neoplasm of temporal lobe: Secondary | ICD-10-CM | POA: Insufficient documentation

## 2022-09-12 DIAGNOSIS — Z9221 Personal history of antineoplastic chemotherapy: Secondary | ICD-10-CM | POA: Insufficient documentation

## 2022-09-13 ENCOUNTER — Inpatient Hospital Stay (HOSPITAL_BASED_OUTPATIENT_CLINIC_OR_DEPARTMENT_OTHER): Payer: BC Managed Care – PPO | Admitting: Internal Medicine

## 2022-09-13 ENCOUNTER — Inpatient Hospital Stay: Payer: BC Managed Care – PPO

## 2022-09-13 ENCOUNTER — Telehealth: Payer: Self-pay | Admitting: Internal Medicine

## 2022-09-13 ENCOUNTER — Other Ambulatory Visit (HOSPITAL_COMMUNITY): Payer: Self-pay

## 2022-09-13 ENCOUNTER — Other Ambulatory Visit: Payer: Self-pay

## 2022-09-13 ENCOUNTER — Other Ambulatory Visit: Payer: Self-pay | Admitting: Pharmacist

## 2022-09-13 VITALS — BP 116/81 | HR 64 | Temp 97.8°F | Resp 18 | Ht 65.0 in | Wt 199.4 lb

## 2022-09-13 DIAGNOSIS — Z79899 Other long term (current) drug therapy: Secondary | ICD-10-CM | POA: Diagnosis not present

## 2022-09-13 DIAGNOSIS — R5383 Other fatigue: Secondary | ICD-10-CM | POA: Diagnosis not present

## 2022-09-13 DIAGNOSIS — G43909 Migraine, unspecified, not intractable, without status migrainosus: Secondary | ICD-10-CM | POA: Diagnosis not present

## 2022-09-13 DIAGNOSIS — Z923 Personal history of irradiation: Secondary | ICD-10-CM | POA: Diagnosis not present

## 2022-09-13 DIAGNOSIS — Z9221 Personal history of antineoplastic chemotherapy: Secondary | ICD-10-CM | POA: Diagnosis not present

## 2022-09-13 DIAGNOSIS — C719 Malignant neoplasm of brain, unspecified: Secondary | ICD-10-CM

## 2022-09-13 DIAGNOSIS — R569 Unspecified convulsions: Secondary | ICD-10-CM

## 2022-09-13 DIAGNOSIS — C712 Malignant neoplasm of temporal lobe: Secondary | ICD-10-CM | POA: Diagnosis not present

## 2022-09-13 LAB — CBC WITH DIFFERENTIAL (CANCER CENTER ONLY)
Abs Immature Granulocytes: 0.03 10*3/uL (ref 0.00–0.07)
Basophils Absolute: 0 10*3/uL (ref 0.0–0.1)
Basophils Relative: 0 %
Eosinophils Absolute: 0.1 10*3/uL (ref 0.0–0.5)
Eosinophils Relative: 3 %
HCT: 39.4 % (ref 36.0–46.0)
Hemoglobin: 13.8 g/dL (ref 12.0–15.0)
Immature Granulocytes: 1 %
Lymphocytes Relative: 20 %
Lymphs Abs: 1.1 10*3/uL (ref 0.7–4.0)
MCH: 31.3 pg (ref 26.0–34.0)
MCHC: 35 g/dL (ref 30.0–36.0)
MCV: 89.3 fL (ref 80.0–100.0)
Monocytes Absolute: 0.5 10*3/uL (ref 0.1–1.0)
Monocytes Relative: 10 %
Neutro Abs: 3.5 10*3/uL (ref 1.7–7.7)
Neutrophils Relative %: 66 %
Platelet Count: 229 10*3/uL (ref 150–400)
RBC: 4.41 MIL/uL (ref 3.87–5.11)
RDW: 11.6 % (ref 11.5–15.5)
WBC Count: 5.3 10*3/uL (ref 4.0–10.5)
nRBC: 0 % (ref 0.0–0.2)

## 2022-09-13 LAB — CMP (CANCER CENTER ONLY)
ALT: 32 U/L (ref 0–44)
AST: 22 U/L (ref 15–41)
Albumin: 5.2 g/dL — ABNORMAL HIGH (ref 3.5–5.0)
Alkaline Phosphatase: 66 U/L (ref 38–126)
Anion gap: 5 (ref 5–15)
BUN: 17 mg/dL (ref 6–20)
CO2: 30 mmol/L (ref 22–32)
Calcium: 10.4 mg/dL — ABNORMAL HIGH (ref 8.9–10.3)
Chloride: 102 mmol/L (ref 98–111)
Creatinine: 0.82 mg/dL (ref 0.44–1.00)
GFR, Estimated: >60 mL/min (ref >60)
Glucose, Bld: 78 mg/dL (ref 70–99)
Potassium: 4.3 mmol/L (ref 3.5–5.1)
Sodium: 137 mmol/L (ref 135–145)
Total Bilirubin: 0.5 mg/dL (ref 0.3–1.2)
Total Protein: 8 g/dL (ref 6.5–8.1)

## 2022-09-13 MED ORDER — TEMOZOLOMIDE 100 MG PO CAPS
200.0000 mg/m2/d | ORAL_CAPSULE | Freq: Every day | ORAL | 0 refills | Status: DC
  Filled 2022-09-13: qty 20, 5d supply, fill #0
  Filled 2022-09-13: qty 28, 7d supply, fill #0

## 2022-09-13 MED ORDER — TEMOZOLOMIDE 100 MG PO CAPS: 200.0000 mg/m2/d | ORAL_CAPSULE | Freq: Every day | ORAL | 0 refills | Status: DC

## 2022-09-13 NOTE — Telephone Encounter (Signed)
Per 11/21 los called and left message for pt about appointment

## 2022-09-13 NOTE — Progress Notes (Addendum)
[facility] [place], [place] [address] [phone]   Interval Evaluation  Date of Service: 09/13/22 Patient Name: Stacy Gutierrez Patient MRN: [id number] Patient DOB: Aug 01, 1991 Provider: Ventura Sellers, MD  Identifying Statement:  TEKESHA ALMGREN is a 31 y.o. 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 Tommi Rumps at [facility].  Path demonstrates WHO 2 diffuse astrocytoma, IDH mt   06/12/2021 Progression   Progression of disease #1   07/09/2021 Procedure   Oocyte retrieval at [facility], prior to initiation of chemotherapy   07/21/2021 - 08/31/2021 Radiation Therapy   IMRT and concurrent Temodar with Dr. Isidore Moos   10/14/2021 -  Chemotherapy   Patient is on Treatment Plan : BRAIN Low Grade Glioma Grade II, Glioblastoma,  Astrocytoma, Oligodendroma, Recurrent or Progressive / Temozolomide D1-5 Q28 Days      Biomarkers:  MGMT Unknown.  IDH 1/2 Mutated.  EGFR Unknown  TERT Unmutated   Interval History: LE FERRAZ presents today for follow up, now having completed cycle #10 of 5-day Temodar.  No new or progressive changes. No further seizures, still on Lamictal 100/100 and Keppra 500/1000.  Fatigue still present.  H+P (06/22/21) Patient presents today as referral from Dr. Ferdinand Lango at [facility], for local radiation and chemotherapy for progressive glioma.  Patient describes breakthrough seizure episode earlier this month, leading to [place] [facility].  Vimpat was poorly tolerated, so Lamictal titration was initiated.  She is currently up to 70m BID, with plans to increase to 1012mBID. Also on Keppra 1500/2000.  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  [facility] team is recommending radiation and  Temodar given recent progression noted on MRI.  No exposure to RT or chemo since diagnosis in 2019.  Medications: Current Outpatient Medications on File Prior to Visit  Medication Sig Dispense Refill   lamoTRIgine (LAMICTAL) 100 MG tablet Take 1 tablet (100 mg total) by mouth 2 (two) times daily. 60 tablet 3   levETIRAcetam (KEPPRA) 1000 MG tablet 500 mg by mouth every morning and 1000 mg at bedtime 120 tablet 2   levonorgestrel (MIRENA, 52 MG,) 20 MCG/DAY IUD 1 each by Intrauterine route once for 1 dose. 1 each 0   LORazepam (ATIVAN) 1 MG tablet Take 1 tablet (1 mg total) by mouth daily as needed. 30 tablet 0   ondansetron (ZOFRAN) 8 MG tablet Take 1 tablet by mouth 2 times daily as needed (nausea and vomiting). May take 30 - 60 minutes prior to Temodar administration if nausea/vomiting occurs. 30 tablet 1   SUMAtriptan (IMITREX) 100 MG tablet Take 1 tablet by mouth See admin instructions. Take 1 tablet by mouth at onset of headache.  May repeat in 2 hours as needed.  No more than 2 tablets in 24 hours. (Patient not taking: Reported on 08/22/2022)     temozolomide (TEMODAR) 100 MG capsule TAKE 4 CAPSULES (400 MG TOTAL) DAILY FOR 5 DAYS. MAY TAKE ON AN EMPTY STOMACH TO DECREASE NAUSEA & VOMITING. (Patient not taking: Reported on 08/22/2022) 20 capsule 0   No current facility-administered medications on file prior to visit.    Allergies: No Known Allergies Past Medical History:  Past Medical History:  Diagnosis Date  Astrocytoma ([place])    Mononucleosis 01/22/2010   Seizures ([place])    Vasovagal syncope    Past Surgical History:  Past Surgical History:  Procedure Laterality Date   CRANIOTOMY Left    TIBIA FRACTURE SURGERY  03/24/2010   Tibial Fracture-left leg--plate/screw--Dr. Linton Rump   TONSILLECTOMY     TYMPANOSTOMY TUBE PLACEMENT Left 08/26/2021   Social History:  Social History   Socioeconomic History   Marital status: Married    Spouse name: Not on file   Number of children: 0    Years of education: Not on file   Highest education level: Not on file  Occupational History   Occupation: Ship broker  Tobacco Use   Smoking status: Never   Smokeless tobacco: Never  Vaping Use   Vaping Use: Never used  Substance and Sexual Activity   Alcohol use: No   Drug use: Not on file   Sexual activity: Yes    Partners: Male    Birth control/protection: I.U.D.  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   Social Determinants of Health   Financial Resource Strain: Not on file  Food Insecurity: Not on file  Transportation Needs: Not on file  Physical Activity: Not on file  Stress: Not on file  Social Connections: Not on file  Intimate Partner Violence: Not on file   Family History:  Family History  Problem Relation Age of Onset   Bladder Cancer Mother    Diabetes type II Maternal Grandmother        "heart issues"   Peripheral vascular disease Maternal Grandmother    Coronary artery disease Other    Diabetes Other    Hyperlipidemia Other    Stroke Other     Review of Systems: Constitutional: Doesn't report fevers, chills or abnormal weight loss Eyes: Doesn't report blurriness of vision Ears, nose, mouth, throat, and face: Doesn't report sore throat Respiratory: Doesn't report cough, dyspnea or wheezes Cardiovascular: Doesn't report palpitation, chest discomfort  Gastrointestinal:  Doesn't report nausea, constipation, diarrhea GU: Doesn't report incontinence Skin: Doesn't report skin rashes Neurological: Per HPI Musculoskeletal: Doesn't report joint pain Behavioral/Psych: Doesn't report anxiety  Physical Exam: Vitals:   09/13/22 1055  BP: 116/81  Pulse: 64  Resp: 18  Temp: 97.8 F (36.6 C)  SpO2: 96%   KPS: 90. General: Alert, cooperative, pleasant, in no acute distress Head: Normal EENT: No conjunctival injection or scleral icterus.  Lungs:  Resp effort normal Cardiac: Regular rate Abdomen: Non-distended abdomen Skin: No rashes cyanosis or petechiae. Extremities: No clubbing or edema  Neurologic Exam: Mental Status: Awake, alert, attentive to examiner. Oriented to self and environment. Language is fluent with intact comprehension.  Cranial Nerves: Visual acuity is grossly normal. Visual fields are full. Extra-ocular movements intact. No ptosis. Face is symmetric Motor: Tone and bulk are normal. Power is full in both arms and legs. Reflexes are symmetric, no pathologic reflexes present.  Sensory: Intact to light touch Gait: Normal.   Labs: I have reviewed the data as listed    Component Value Date/Time   NA 138 08/22/2022 1104   K 4.2 08/22/2022 1104   CL 101 08/22/2022 1104   CO2 30 08/22/2022 1104   GLUCOSE 85 08/22/2022 1104   BUN 13 08/22/2022 1104   CREATININE 0.78 08/22/2022 1104   CALCIUM 9.8 08/22/2022 1104   PROT 7.8 08/22/2022 1104   ALBUMIN  4.7 08/22/2022 1104   AST 20 08/22/2022 1104   ALT 35 08/22/2022 1104   ALKPHOS 68 08/22/2022 1104   BILITOT 0.5 08/22/2022 1104   GFRNONAA >60 08/22/2022 1104   Lab Results  Component Value Date   WBC 5.3 09/13/2022   NEUTROABS 3.5 09/13/2022   HGB 13.8 09/13/2022   HCT 39.4 09/13/2022   MCV 89.3 09/13/2022   PLT 229 09/13/2022   Imaging:  [place] Clinician Interpretation: I have personally reviewed the CNS images as listed.  My interpretation, in the context of the patient's clinical presentation, is stable disease  MR Brain W Wo Contrast  Result Date: 09/10/2022 CLINICAL DATA:  Astrocytoma follow-up EXAM: MRI HEAD WITHOUT AND WITH CONTRAST TECHNIQUE: Multiplanar, multiecho pulse sequences of the brain and surrounding structures were obtained without and with intravenous contrast. CONTRAST:  32m GADAVIST GADOBUTROL 1 MMOL/ML IV SOLN COMPARISON:  07/15/2022 FINDINGS: Brain: FLAIR hyperintensity with swelling and gray-white indistinctness around the left  perisylvian resection cavity, especially inferiorly at the inferior frontal lobe and anterior temporal lobe. No progression or midline shift. A punctate focus of enhancement highlighted on prior has resolved. No worrisome enhancement today. No incidental infarct, hydrocephalus, or collection. Vascular: Major flow voids and vascular enhancements are preserved Skull and upper cervical spine: Unremarkable craniotomy site Sinuses/Orbits: Negative IMPRESSION: Stable infiltrating tumor around the resection cavity. Small focus of enhancement on prior has resolved, no worrisome enhancement today. Electronically Signed   By: JJorje GuildM.D.   On: 09/10/2022 09:33   Assessment/Plan Astrocytoma ([place]  Seizures ([place]  AJESSENYA BERDANis clinically stable today, now having completed cycle #10 of 5-day Temodar.  MRI brain demonstrates stable findings.  We recommended continuing treatment with cycle #11 Temozolomide 2058mm2, on for five days and off for twenty three days in twenty eight day cycles. The patient will have a complete blood count performed on days 21 and 28 of each cycle, and a comprehensive metabolic panel performed on day 28 of each cycle. Labs may need to be performed more often. Zofran will prescribed for home use for nausea/vomiting.   Chemotherapy should be held for the following:  ANC less than 1,000  Platelets less than 100,000  LFT or creatinine greater than 2x ULN  If clinical concerns/contraindications develop  For seizures, will decrease Keppra to 5009mID (fatigue) and con't Lamictal 100m76mD.  May dose sumatriptan, NSAIDs or Tylenol for breakthrough migraines.  We ask that AlexDORYCE MCGREGORYurn to clinic in 1 months prior to cycle #12 with labs for evaluation, or sooner as needed.  All questions were answered. The patient knows to call the clinic with any problems, questions or concerns. No barriers to learning were detected.  The total time spent in the encounter was 30  minutes and more than 50% was on counseling and review of test results   ZachVentura Sellers Medical Director of Neuro-Oncology [facility]21/23 11:12 AM

## 2022-09-13 NOTE — Progress Notes (Signed)
Oral Oncology Pharmacist Encounter  Patient has had change in insurance now requiring Temodar (temozolomide) to be filled through [facility]. Prescription redirected from [facility] to [facility] for dispensing.   Leron Croak, PharmD, BCPS, BCOP Hematology/Oncology Clinical Pharmacist [facility] [place] [phone] 09/13/2022 2:20 PM

## 2022-09-14 ENCOUNTER — Other Ambulatory Visit (HOSPITAL_COMMUNITY): Payer: Self-pay

## 2022-09-14 ENCOUNTER — Other Ambulatory Visit: Payer: Self-pay

## 2022-09-16 ENCOUNTER — Other Ambulatory Visit (HOSPITAL_COMMUNITY): Payer: Self-pay

## 2022-09-22 ENCOUNTER — Other Ambulatory Visit: Payer: Self-pay

## 2022-09-23 ENCOUNTER — Telehealth: Payer: Self-pay

## 2022-09-23 ENCOUNTER — Encounter: Payer: Self-pay | Admitting: Internal Medicine

## 2022-09-23 ENCOUNTER — Telehealth: Payer: Self-pay | Admitting: *Deleted

## 2022-09-23 NOTE — Telephone Encounter (Signed)
This RN left a voicemail on patient's phone returning voicemail left on [facility] phone. Patient is reporting a rash from last night which resolved with benadryl. Patient states rash returned this afternoon and she is unsure if it is from the Lamictal. This RN advised that we have no openings today in [facility], but we will be able to see her on Monday if she still wishes to be evaluated.

## 2022-09-23 NOTE — Telephone Encounter (Signed)
Received call back from pt. She states the rash has returned and sent a picture of the rash on MyChart. It does appear to be a drug rash. Advised to take the benadryl 25 mg every 6 hours and pepcid 20 mg every 12 hours. I advised that I would contact Dr. Mickeal Skinner about her situation.  She does state that her lamictal is from a different manufacturer with her last refill.  Dr. Mickeal Skinner notified.  HIs instructions are to stop the lamictal and increase the keppra to 1500 mg twice a day.  Call back to patient . Provided instructions per Dr. Mickeal Skinner recommendations. She voiced understanding and was able to repeat back instructions to stop lamictal and to increase keppra. She states she has enough Keppra to increase the dose as directed. Advised to take the benadryl for next 24 hours as well. Pt voiced understanding. Instructed to call back over the weekend with any other changes or concerns. She voiced understanding.

## 2022-09-23 NOTE — Telephone Encounter (Signed)
After hours call received on 11/30 '@5'$ :30 pm.  Patient states she has a rash all over her torso, back of ears and legs.  She finished her last Temodar 3 days ago.  She states she took benadryl and placed hydrocortisone on the rash and this morning upon calling her she stated that it resolved.  She has been on Temodar and Lamictal for several months without any issues.  Instructed her to repeat if occurs again and to let us know.

## 2022-09-26 ENCOUNTER — Encounter: Payer: Self-pay | Admitting: Internal Medicine

## 2022-10-03 ENCOUNTER — Other Ambulatory Visit (HOSPITAL_COMMUNITY): Payer: Self-pay

## 2022-10-05 ENCOUNTER — Other Ambulatory Visit: Payer: Self-pay

## 2022-10-07 ENCOUNTER — Other Ambulatory Visit (HOSPITAL_COMMUNITY): Payer: Self-pay

## 2022-10-10 ENCOUNTER — Other Ambulatory Visit: Payer: Self-pay | Admitting: *Deleted

## 2022-10-10 DIAGNOSIS — C719 Malignant neoplasm of brain, unspecified: Secondary | ICD-10-CM

## 2022-10-11 ENCOUNTER — Inpatient Hospital Stay: Payer: BC Managed Care – PPO | Attending: Internal Medicine

## 2022-10-11 ENCOUNTER — Other Ambulatory Visit: Payer: Self-pay

## 2022-10-11 ENCOUNTER — Inpatient Hospital Stay (HOSPITAL_BASED_OUTPATIENT_CLINIC_OR_DEPARTMENT_OTHER): Payer: BC Managed Care – PPO | Admitting: Internal Medicine

## 2022-10-11 ENCOUNTER — Other Ambulatory Visit (HOSPITAL_COMMUNITY): Payer: Self-pay

## 2022-10-11 VITALS — BP 126/86 | HR 66 | Temp 97.7°F | Resp 17 | Ht 65.0 in | Wt 200.2 lb

## 2022-10-11 DIAGNOSIS — Z923 Personal history of irradiation: Secondary | ICD-10-CM | POA: Insufficient documentation

## 2022-10-11 DIAGNOSIS — Z79899 Other long term (current) drug therapy: Secondary | ICD-10-CM | POA: Insufficient documentation

## 2022-10-11 DIAGNOSIS — R5383 Other fatigue: Secondary | ICD-10-CM | POA: Diagnosis not present

## 2022-10-11 DIAGNOSIS — G43909 Migraine, unspecified, not intractable, without status migrainosus: Secondary | ICD-10-CM | POA: Insufficient documentation

## 2022-10-11 DIAGNOSIS — C719 Malignant neoplasm of brain, unspecified: Secondary | ICD-10-CM | POA: Diagnosis not present

## 2022-10-11 DIAGNOSIS — C712 Malignant neoplasm of temporal lobe: Secondary | ICD-10-CM | POA: Diagnosis not present

## 2022-10-11 DIAGNOSIS — R569 Unspecified convulsions: Secondary | ICD-10-CM

## 2022-10-11 DIAGNOSIS — Z9221 Personal history of antineoplastic chemotherapy: Secondary | ICD-10-CM | POA: Diagnosis not present

## 2022-10-11 LAB — CMP (CANCER CENTER ONLY)
ALT: 34 U/L (ref 0–44)
AST: 22 U/L (ref 15–41)
Albumin: 4.5 g/dL (ref 3.5–5.0)
Alkaline Phosphatase: 73 U/L (ref 38–126)
Anion gap: 7 (ref 5–15)
BUN: 15 mg/dL (ref 6–20)
CO2: 26 mmol/L (ref 22–32)
Calcium: 9.5 mg/dL (ref 8.9–10.3)
Chloride: 104 mmol/L (ref 98–111)
Creatinine: 0.76 mg/dL (ref 0.44–1.00)
GFR, Estimated: >60 mL/min (ref >60)
Glucose, Bld: 91 mg/dL (ref 70–99)
Potassium: 4 mmol/L (ref 3.5–5.1)
Sodium: 137 mmol/L (ref 135–145)
Total Bilirubin: 0.4 mg/dL (ref 0.3–1.2)
Total Protein: 7.7 g/dL (ref 6.5–8.1)

## 2022-10-11 LAB — CBC WITH DIFFERENTIAL (CANCER CENTER ONLY)
Abs Immature Granulocytes: 0.03 10*3/uL (ref 0.00–0.07)
Basophils Absolute: 0 10*3/uL (ref 0.0–0.1)
Basophils Relative: 1 %
Eosinophils Absolute: 0.2 10*3/uL (ref 0.0–0.5)
Eosinophils Relative: 3 %
HCT: 39.3 % (ref 36.0–46.0)
Hemoglobin: 13.9 g/dL (ref 12.0–15.0)
Immature Granulocytes: 1 %
Lymphocytes Relative: 17 %
Lymphs Abs: 1.1 10*3/uL (ref 0.7–4.0)
MCH: 31.8 pg (ref 26.0–34.0)
MCHC: 35.4 g/dL (ref 30.0–36.0)
MCV: 89.9 fL (ref 80.0–100.0)
Monocytes Absolute: 0.6 10*3/uL (ref 0.1–1.0)
Monocytes Relative: 10 %
Neutro Abs: 4.4 10*3/uL (ref 1.7–7.7)
Neutrophils Relative %: 68 %
Platelet Count: 189 10*3/uL (ref 150–400)
RBC: 4.37 MIL/uL (ref 3.87–5.11)
RDW: 11.9 % (ref 11.5–15.5)
WBC Count: 6.3 10*3/uL (ref 4.0–10.5)
nRBC: 0 % (ref 0.0–0.2)

## 2022-10-11 MED ORDER — TEMOZOLOMIDE 100 MG PO CAPS
200.0000 mg/m2/d | ORAL_CAPSULE | Freq: Every day | ORAL | 0 refills | Status: DC
  Filled 2022-10-11: qty 20, 5d supply, fill #0

## 2022-10-11 NOTE — Progress Notes (Signed)
Stacy Gutierrez at [place], [place] [address] [phone]   Interval Evaluation  Date of Service: 10/11/22 Patient Name: Stacy Gutierrez Patient MRN: [id number] Patient DOB: July 06, 1991 Provider: Ventura Sellers, MD  Identifying Statement:  Stacy Gutierrez is a 31 y.o. 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 Tommi Rumps at [facility].  Path demonstrates WHO 2 diffuse astrocytoma, IDH mt   06/12/2021 Progression   Progression of disease #1   07/09/2021 Procedure   Oocyte retrieval at [place], prior to initiation of chemotherapy   07/21/2021 - 08/31/2021 Radiation Therapy   IMRT and concurrent Temodar with Dr. Isidore Moos   10/14/2021 -  Chemotherapy   Patient is on Treatment Plan : BRAIN Low Grade Glioma Grade II, Glioblastoma,  Astrocytoma, Oligodendroma, Recurrent or Progressive / Temozolomide D1-5 Q28 Days      Biomarkers:  MGMT Unknown.  IDH 1/2 Mutated.  EGFR Unknown  TERT Unmutated   Interval History: Stacy Gutierrez presents today for follow up, now having completed cycle #11 of 5-day Temodar.  She did experience severe whole body rash with Lamictal, which was discontinued and replaced with Keppra.  Otherwise no new or progressive changes. No further seizures.  Fatigue still present.  H+P (06/22/21) Patient presents today as referral from Dr. Ferdinand Lango at [facility], for local radiation and chemotherapy for progressive glioma.  Patient describes breakthrough seizure episode earlier this month, leading to [place] [facility].  Vimpat was poorly tolerated, so Lamictal titration was initiated.  She is currently up to 42m BID, with plans to increase to 1048mBID. Also on Keppra 1500/2000.  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  [facility] team is recommending radiation and Temodar given recent progression noted on MRI.  No exposure to RT or chemo since diagnosis in 2019.  Medications: Current Outpatient Medications on File Prior to Visit  Medication Sig Dispense Refill   lamoTRIgine (LAMICTAL) 100 MG tablet Take 1 tablet (100 mg total) by mouth 2 (two) times daily. 60 tablet 3   levETIRAcetam (KEPPRA) 1000 MG tablet 500 mg by mouth every morning and 1000 mg at bedtime 120 tablet 2   levonorgestrel (MIRENA, 52 MG,) 20 MCG/DAY IUD 1 each by Intrauterine route once for 1 dose. 1 each 0   LORazepam (ATIVAN) 1 MG tablet Take 1 tablet (1 mg total) by mouth daily as needed. 30 tablet 0   ondansetron (ZOFRAN) 8 MG tablet Take 1 tablet by mouth 2 times daily as needed (nausea and vomiting). May take 30 - 60 minutes prior to Temodar administration if nausea/vomiting occurs. 30 tablet 1   SUMAtriptan (IMITREX) 100 MG tablet Take 1 tablet by mouth See admin instructions. Take 1 tablet by mouth at onset of headache.  May repeat in 2 hours as needed.  No more than 2 tablets in 24 hours. (Patient not taking: Reported on 08/22/2022)     temozolomide (TEMODAR) 100 MG capsule Take 4 capsules (400 mg total) by mouth daily. Take for 5 days on, 23 days off. Repeat every 28 days. May take on an empty stomach to decrease nausea & vomiting. 20 capsule 0   No current facility-administered medications on file prior to visit.  Allergies: No Known Allergies Past Medical History:  Past Medical History:  Diagnosis Date   Astrocytoma ([place])    Mononucleosis 01/22/2010   Seizures ([place])    Vasovagal syncope    Past Surgical History:  Past Surgical History:  Procedure Laterality Date   CRANIOTOMY Left    TIBIA FRACTURE SURGERY  03/24/2010   Tibial Fracture-left leg--plate/screw--Dr. [place] Rump   TONSILLECTOMY     TYMPANOSTOMY TUBE PLACEMENT Left 08/26/2021   Social History:  Social History   Socioeconomic History    Marital status: Married    Spouse name: Not on file   Number of children: 0   Years of education: Not on file   Highest education level: Not on file  Occupational History   Occupation: Ship broker  Tobacco Use   Smoking status: Never   Smokeless tobacco: Never  Vaping Use   Vaping Use: Never used  Substance and Sexual Activity   Alcohol use: No   Drug use: Not on file   Sexual activity: Yes    Partners: Male    Birth control/protection: I.U.D.  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   Social Determinants of Health   Financial Resource Strain: Not on file  Food Insecurity: Not on file  Transportation Needs: Not on file  Physical Activity: Not on file  Stress: Not on file  Social Connections: Not on file  Intimate Partner Violence: Not on file   Family History:  Family History  Problem Relation Age of Onset   Bladder Cancer Mother    Diabetes type II Maternal Grandmother        "heart issues"   Peripheral vascular disease Maternal Grandmother    Coronary artery disease Other    Diabetes Other    Hyperlipidemia Other    Stroke Other     Review of Systems: Constitutional: Doesn't report fevers, chills or abnormal weight loss Eyes: Doesn't report blurriness of vision Ears, nose, mouth, throat, and face: Doesn't report sore throat Respiratory: Doesn't report cough, dyspnea or wheezes Cardiovascular: Doesn't report palpitation, chest discomfort  Gastrointestinal:  Doesn't report nausea, constipation, diarrhea GU: Doesn't report incontinence Skin: Doesn't report skin rashes Neurological: Per HPI Musculoskeletal: Doesn't report joint pain Behavioral/Psych: Doesn't report anxiety  Physical Exam: Vitals:   10/11/22 1111  BP: 126/86  Pulse: 66  Resp: 17  Temp: 97.7 F (36.5 C)  SpO2: 97%   KPS: 90. General: Alert, cooperative, pleasant, in no acute  distress Head: Normal EENT: No conjunctival injection or scleral icterus.  Lungs: Resp effort normal Cardiac: Regular rate Abdomen: Non-distended abdomen Skin: No rashes cyanosis or petechiae. Extremities: No clubbing or edema  Neurologic Exam: Mental Status: Awake, alert, attentive to examiner. Oriented to self and environment. Language is fluent with intact comprehension.  Cranial Nerves: Visual acuity is grossly normal. Visual fields are full. Extra-ocular movements intact. No ptosis. Face is symmetric Motor: Tone and bulk are normal. Power is full in both arms and legs. Reflexes are symmetric, no pathologic reflexes present.  Sensory: Intact to light touch Gait: Normal.   Labs: I have reviewed the data as listed    Component Value Date/Time   NA 137 09/13/2022 1044   K 4.3 09/13/2022 1044   CL 102 09/13/2022 1044   CO2 30 09/13/2022 1044   GLUCOSE 78 09/13/2022 1044   BUN 17 09/13/2022 1044   CREATININE 0.82 09/13/2022  1044   CALCIUM 10.4 (H) 09/13/2022 1044   PROT 8.0 09/13/2022 1044   ALBUMIN 5.2 (H) 09/13/2022 1044   AST 22 09/13/2022 1044   ALT 32 09/13/2022 1044   ALKPHOS 66 09/13/2022 1044   BILITOT 0.5 09/13/2022 1044   GFRNONAA >60 09/13/2022 1044   Lab Results  Component Value Date   WBC 6.3 10/11/2022   NEUTROABS 4.4 10/11/2022   HGB 13.9 10/11/2022   HCT 39.3 10/11/2022   MCV 89.9 10/11/2022   PLT 189 10/11/2022     Assessment/Plan Astrocytoma (HCC)  Seizures ([place])  BEVERLEY ALLENDER is clinically stable today, now having completed cycle #11 of 5-day Temodar.  No clinical changes today.  We recommended continuing treatment with cycle #12 Temozolomide 227m/m2, on for five days and off for twenty three days in twenty eight day cycles. The patient will have a complete blood count performed on days 21 and 28 of each cycle, and a comprehensive metabolic panel performed on day 28 of each cycle. Labs may need to be performed more often. Zofran will  prescribed for home use for nausea/vomiting.   Chemotherapy should be held for the following:  ANC less than 1,000  Platelets less than 100,000  LFT or creatinine greater than 2x ULN  If clinical concerns/contraindications develop  For seizures will con't Keppra 15024mBID.  May dose sumatriptan, NSAIDs or Tylenol for breakthrough migraines.  We ask that AlBRAYLON LEMMONSeturn to clinic in 1 months with MRI brain for evaluation, or sooner as needed.  All questions were answered. The patient knows to call the clinic with any problems, questions or concerns. No barriers to learning were detected.  The total time spent in the encounter was 30 minutes and more than 50% was on counseling and review of test results   ZaVentura SellersMD Medical Director of Neuro-Oncology [facility] WeCuartelez2/19/23 11:19 AM

## 2022-10-12 ENCOUNTER — Encounter: Payer: Self-pay | Admitting: Internal Medicine

## 2022-10-12 ENCOUNTER — Other Ambulatory Visit: Payer: Self-pay

## 2022-10-21 ENCOUNTER — Telehealth: Payer: Self-pay | Admitting: *Deleted

## 2022-10-21 NOTE — Telephone Encounter (Signed)
Received call from pt stating that after 3 nights of her Temodar she experienced significant back from neck to tailbone with the right side worse than  the left. Pt denies fever, chills. She does state it aches worse when she looks up but not turning head side to side and looking down.  She needs refill of her Keppra She states she is on '1500mg'$  BID.  She had reaction to Lamictal and is now on increased dose of Keppra  Please advise on "spine/back" soreness

## 2022-10-22 ENCOUNTER — Other Ambulatory Visit: Payer: Self-pay | Admitting: Internal Medicine

## 2022-10-22 MED ORDER — LEVETIRACETAM 500 MG PO TABS
1500.0000 mg | ORAL_TABLET | Freq: Two times a day (BID) | ORAL | 3 refills | Status: DC
  Filled 2022-10-22: qty 180, 30d supply, fill #0

## 2022-10-23 ENCOUNTER — Other Ambulatory Visit (HOSPITAL_COMMUNITY): Payer: Self-pay

## 2022-10-24 ENCOUNTER — Other Ambulatory Visit (HOSPITAL_COMMUNITY): Payer: Self-pay

## 2022-10-25 ENCOUNTER — Other Ambulatory Visit: Payer: Self-pay

## 2022-10-25 ENCOUNTER — Other Ambulatory Visit: Payer: Self-pay | Admitting: Radiation Therapy

## 2022-10-25 ENCOUNTER — Other Ambulatory Visit: Payer: Self-pay | Admitting: *Deleted

## 2022-10-25 ENCOUNTER — Other Ambulatory Visit (HOSPITAL_COMMUNITY): Payer: Self-pay

## 2022-10-25 MED ORDER — LEVETIRACETAM 500 MG PO TABS: 1500.0000 mg | ORAL_TABLET | Freq: Two times a day (BID) | ORAL | 3 refills | Status: DC

## 2022-10-26 ENCOUNTER — Telehealth: Payer: Self-pay | Admitting: *Deleted

## 2022-10-26 MED ORDER — DEXAMETHASONE 2 MG PO TABS: 2.0000 mg | ORAL_TABLET | Freq: Every day | ORAL | 0 refills | Status: DC

## 2022-10-26 NOTE — Addendum Note (Signed)
Addended by: Aura Fey A on: 10/26/2022 02:12 PM   Modules accepted: Orders

## 2022-10-26 NOTE — Telephone Encounter (Signed)
Received recommendations from Dr. Mickeal Skinner. Called pt and spoke with her. Advised that Dr. Mickeal Skinner recommended Decadron '2mg'$   once a day until and pain subside. This has been called in to her pharmacy. Pt voiced understanding.

## 2022-10-26 NOTE — Telephone Encounter (Signed)
Received vm message from patient. She states she finished her Temodar on Saturday.She developed an all over rash again yesterday that was redder and worse than the last time. She took Benadryl and put hydrocortisone on her rash last night. She states it is a bit better today though she is concerned that it may get worse as the day goes on. She also states that she developed the same low back pain with the temodar (she thinks that is what is causing it). She is uncertain as to what to do.   Please advise

## 2022-10-27 ENCOUNTER — Telehealth: Payer: Self-pay | Admitting: *Deleted

## 2022-10-27 NOTE — Telephone Encounter (Signed)
-----   Message from Ventura Sellers, MD sent at 10/27/2022 12:23 PM EST ----- Naproxen '500mg'$  may be longer acting, also ok with concurrent Tylenol ----- Message ----- From: Rolene Course, RN Sent: 10/27/2022  12:18 PM EST To: Ventura Sellers, MD  I spoke with this patient, she wants to keep her appointment as scheduled on 1/16.  Do you have any recommendations for what she can take for her pain besides ibuprofen?  Thanks, Bethena Roys

## 2022-10-27 NOTE — Telephone Encounter (Signed)
PC to patient, no answer, left VM - informed patient of Dr Renda Rolls advice as below.  Instructed patient to call this office with any further questions/concerns, [phone].

## 2022-10-31 ENCOUNTER — Other Ambulatory Visit (HOSPITAL_COMMUNITY): Payer: Self-pay

## 2022-11-01 ENCOUNTER — Other Ambulatory Visit (HOSPITAL_COMMUNITY): Payer: Self-pay

## 2022-11-01 ENCOUNTER — Encounter: Payer: Self-pay | Admitting: Internal Medicine

## 2022-11-01 ENCOUNTER — Telehealth: Payer: Self-pay | Admitting: Pharmacy Technician

## 2022-11-01 ENCOUNTER — Other Ambulatory Visit: Payer: Self-pay | Admitting: Internal Medicine

## 2022-11-01 MED ORDER — TRAMADOL HCL 50 MG PO TABS: 50.0000 mg | ORAL_TABLET | Freq: Four times a day (QID) | ORAL | 1 refills | Status: DC | PRN

## 2022-11-01 NOTE — Telephone Encounter (Signed)
Oral Oncology Patient Advocate Encounter   Was successful in securing patient an $1,700 grant from Patient [facility]) to provide copayment coverage for Temozolomide.  This will keep the out of pocket expense at $0.     I have spoken with the patient.    The billing information is as follows and has been shared with [place].   Member ID: [id number] Group ID: [id number] RxBin: [id number] Dates of Eligibility: 08/03/22 through 11/01/23  Fund:  [place], [place] Patient [place] Direct Number: [phone]  Fax: [phone]

## 2022-11-02 ENCOUNTER — Other Ambulatory Visit (HOSPITAL_COMMUNITY): Payer: Self-pay

## 2022-11-04 ENCOUNTER — Ambulatory Visit (HOSPITAL_COMMUNITY)
Admission: RE | Admit: 2022-11-04 | Discharge: 2022-11-04 | Disposition: A | Discharge status: Home / Self Care | Payer: BC Managed Care – PPO | Source: Ambulatory Visit | Attending: Internal Medicine | Admitting: Internal Medicine

## 2022-11-04 DIAGNOSIS — C719 Malignant neoplasm of brain, unspecified: Secondary | ICD-10-CM | POA: Insufficient documentation

## 2022-11-04 DIAGNOSIS — G9389 Other specified disorders of brain: Secondary | ICD-10-CM | POA: Diagnosis not present

## 2022-11-04 DIAGNOSIS — D496 Neoplasm of unspecified behavior of brain: Secondary | ICD-10-CM | POA: Diagnosis not present

## 2022-11-04 MED ORDER — GADOBUTROL 1 MMOL/ML IV SOLN
9.0000 mL | Freq: Once | INTRAVENOUS | Status: AC | PRN
  Administered 2022-11-04: 9 mL via INTRAVENOUS

## 2022-11-07 ENCOUNTER — Inpatient Hospital Stay: Payer: BC Managed Care – PPO | Attending: Internal Medicine

## 2022-11-07 ENCOUNTER — Other Ambulatory Visit: Payer: Self-pay

## 2022-11-07 DIAGNOSIS — C719 Malignant neoplasm of brain, unspecified: Secondary | ICD-10-CM

## 2022-11-07 DIAGNOSIS — Z9221 Personal history of antineoplastic chemotherapy: Secondary | ICD-10-CM | POA: Insufficient documentation

## 2022-11-07 DIAGNOSIS — C712 Malignant neoplasm of temporal lobe: Secondary | ICD-10-CM | POA: Insufficient documentation

## 2022-11-07 DIAGNOSIS — Z923 Personal history of irradiation: Secondary | ICD-10-CM | POA: Insufficient documentation

## 2022-11-07 DIAGNOSIS — Z79899 Other long term (current) drug therapy: Secondary | ICD-10-CM | POA: Insufficient documentation

## 2022-11-07 DIAGNOSIS — R5383 Other fatigue: Secondary | ICD-10-CM | POA: Insufficient documentation

## 2022-11-07 DIAGNOSIS — G43909 Migraine, unspecified, not intractable, without status migrainosus: Secondary | ICD-10-CM | POA: Insufficient documentation

## 2022-11-08 ENCOUNTER — Inpatient Hospital Stay: Payer: BC Managed Care – PPO | Admitting: Internal Medicine

## 2022-11-08 ENCOUNTER — Inpatient Hospital Stay: Payer: BC Managed Care – PPO

## 2022-11-08 ENCOUNTER — Other Ambulatory Visit: Payer: Self-pay

## 2022-11-08 VITALS — BP 112/80 | HR 55 | Temp 97.7°F | Resp 20 | Wt 198.4 lb

## 2022-11-08 DIAGNOSIS — G43909 Migraine, unspecified, not intractable, without status migrainosus: Secondary | ICD-10-CM | POA: Diagnosis not present

## 2022-11-08 DIAGNOSIS — C712 Malignant neoplasm of temporal lobe: Secondary | ICD-10-CM | POA: Diagnosis not present

## 2022-11-08 DIAGNOSIS — Z923 Personal history of irradiation: Secondary | ICD-10-CM | POA: Diagnosis not present

## 2022-11-08 DIAGNOSIS — R569 Unspecified convulsions: Secondary | ICD-10-CM

## 2022-11-08 DIAGNOSIS — C719 Malignant neoplasm of brain, unspecified: Secondary | ICD-10-CM | POA: Diagnosis not present

## 2022-11-08 DIAGNOSIS — Z9221 Personal history of antineoplastic chemotherapy: Secondary | ICD-10-CM | POA: Diagnosis not present

## 2022-11-08 DIAGNOSIS — Z79899 Other long term (current) drug therapy: Secondary | ICD-10-CM | POA: Diagnosis not present

## 2022-11-08 DIAGNOSIS — R5383 Other fatigue: Secondary | ICD-10-CM | POA: Diagnosis not present

## 2022-11-08 LAB — CBC WITH DIFFERENTIAL (CANCER CENTER ONLY)
Abs Immature Granulocytes: 0.02 10*3/uL (ref 0.00–0.07)
Basophils Absolute: 0 10*3/uL (ref 0.0–0.1)
Basophils Relative: 0 %
Eosinophils Absolute: 0.1 10*3/uL (ref 0.0–0.5)
Eosinophils Relative: 3 %
HCT: 38.7 % (ref 36.0–46.0)
Hemoglobin: 13.8 g/dL (ref 12.0–15.0)
Immature Granulocytes: 0 %
Lymphocytes Relative: 22 %
Lymphs Abs: 1.2 10*3/uL (ref 0.7–4.0)
MCH: 31.7 pg (ref 26.0–34.0)
MCHC: 35.7 g/dL (ref 30.0–36.0)
MCV: 89 fL (ref 80.0–100.0)
Monocytes Absolute: 0.6 10*3/uL (ref 0.1–1.0)
Monocytes Relative: 10 %
Neutro Abs: 3.6 10*3/uL (ref 1.7–7.7)
Neutrophils Relative %: 65 %
Platelet Count: 231 10*3/uL (ref 150–400)
RBC: 4.35 MIL/uL (ref 3.87–5.11)
RDW: 11.5 % (ref 11.5–15.5)
WBC Count: 5.6 10*3/uL (ref 4.0–10.5)
nRBC: 0 % (ref 0.0–0.2)

## 2022-11-08 LAB — CMP (CANCER CENTER ONLY)
ALT: 32 U/L (ref 0–44)
AST: 20 U/L (ref 15–41)
Albumin: 4.6 g/dL (ref 3.5–5.0)
Alkaline Phosphatase: 69 U/L (ref 38–126)
Anion gap: 7 (ref 5–15)
BUN: 18 mg/dL (ref 6–20)
CO2: 27 mmol/L (ref 22–32)
Calcium: 10 mg/dL (ref 8.9–10.3)
Chloride: 104 mmol/L (ref 98–111)
Creatinine: 0.65 mg/dL (ref 0.44–1.00)
GFR, Estimated: >60 mL/min (ref >60)
Glucose, Bld: 74 mg/dL (ref 70–99)
Potassium: 4 mmol/L (ref 3.5–5.1)
Sodium: 138 mmol/L (ref 135–145)
Total Bilirubin: 0.3 mg/dL (ref 0.3–1.2)
Total Protein: 7.5 g/dL (ref 6.5–8.1)

## 2022-11-08 NOTE — Progress Notes (Signed)
[place] at [place], [place] [address] [phone]   Interval Evaluation  Date of Service: 11/08/22 Patient Name: Stacy Gutierrez Patient MRN: [id number] Patient DOB: [birth year] Provider: Ventura Sellers, MD  Identifying Statement:  Stacy Gutierrez is a 32 y.o. 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 Tommi Rumps at [facility].  Path demonstrates WHO 2 diffuse astrocytoma, IDH mt   06/12/2021 Progression   Progression of disease #1   07/09/2021 Procedure   Oocyte retrieval at [place], prior to initiation of chemotherapy   07/21/2021 - 08/31/2021 Radiation Therapy   IMRT and concurrent Temodar with Dr. Isidore Moos   10/14/2021 -  Chemotherapy   Patient is on Treatment Plan : BRAIN Low Grade Glioma Grade II, Glioblastoma,  Astrocytoma, Oligodendroma, Recurrent or Progressive / Temozolomide D1-5 Q28 Days      Biomarkers:  MGMT Unknown.  IDH 1/2 Mutated.  EGFR Unknown  TERT Unmutated   Interval History: Stacy Gutierrez presents today for follow up, now having completed cycle #12 of 5-day Temodar.  This month she has experienced new onset back pain, aching right lower back upon awakening.  This has actually improved in recent days.  Otherwise no associated neurologic deficits. No further seizures.  Fatigue still present.  H+P (06/22/21) Patient presents today as referral from Dr. Ferdinand Lango at [facility], for local radiation and chemotherapy for progressive glioma.  Patient describes breakthrough seizure episode earlier this month, leading to [place] [facility].  Vimpat was poorly tolerated, so Lamictal titration was initiated.  She is currently up to '50mg'$  BID, with plans to increase to '100mg'$  BID. Also on Keppra 1500/2000.  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  [facility] team is recommending radiation and Temodar given recent progression noted on MRI.  No exposure to RT or chemo since diagnosis in 2019.  Medications: Current Outpatient Medications on File Prior to Visit  Medication Sig Dispense Refill   traMADol (ULTRAM) 50 MG tablet Take 1 tablet (50 mg total) by mouth every 6 (six) hours as needed for severe pain. 30 tablet 1   dexamethasone (DECADRON) 2 MG tablet Take 1 tablet (2 mg total) by mouth daily. Take daily until rash subsides. 7 tablet 0   lamoTRIgine (LAMICTAL) 100 MG tablet Take 1 tablet (100 mg total) by mouth 2 (two) times daily. 60 tablet 3   levETIRAcetam (KEPPRA) 500 MG tablet Take 3 tablets (1,500 mg total) by mouth 2 (two) times daily. 180 tablet 3   levonorgestrel (MIRENA, 52 MG,) 20 MCG/DAY IUD 1 each by Intrauterine route once for 1 dose. 1 each 0   LORazepam (ATIVAN) 1 MG tablet Take 1 tablet (1 mg total) by mouth daily as needed. 30 tablet 0   ondansetron (ZOFRAN) 8 MG tablet Take 1 tablet by mouth 2 times daily as needed (nausea and vomiting). May take 30 - 60 minutes prior to Temodar administration if nausea/vomiting occurs. 30 tablet 1   SUMAtriptan (IMITREX) 100 MG tablet Take 1 tablet by mouth See admin instructions. Take 1 tablet by mouth at onset of headache.  May repeat in 2 hours as needed.  No more than 2 tablets in 24 hours. (Patient not taking: Reported on  08/22/2022)     temozolomide (TEMODAR) 100 MG capsule Take 4 capsules (400 mg total) by mouth daily. Take for 5 days on, 23 days off. Repeat every 28 days. May take on an empty stomach to decrease nausea & vomiting. 20 capsule 0   No current facility-administered medications on file prior to visit.    Allergies: No Known Allergies Past Medical History:  Past Medical History:  Diagnosis Date   Astrocytoma ([place])    Mononucleosis 01/22/2010   Seizures ([place])    Vasovagal syncope    Past Surgical  History:  Past Surgical History:  Procedure Laterality Date   CRANIOTOMY Left    TIBIA FRACTURE SURGERY  03/24/2010   Tibial Fracture-left leg--plate/screw--Dr. Linton Rump   TONSILLECTOMY     TYMPANOSTOMY TUBE PLACEMENT Left 08/26/2021   Social History:  Social History   Socioeconomic History   Marital status: Married    Spouse name: Not on file   Number of children: 0   Years of education: Not on file   Highest education level: Not on file  Occupational History   Occupation: Ship broker  Tobacco Use   Smoking status: Never   Smokeless tobacco: Never  Vaping Use   Vaping Use: Never used  Substance and Sexual Activity   Alcohol use: No   Drug use: Not on file   Sexual activity: Yes    Partners: Male    Birth control/protection: I.U.D.  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   Social Determinants of Health   Financial Resource Strain: Not on file  Food Insecurity: Not on file  Transportation Needs: Not on file  Physical Activity: Not on file  Stress: Not on file  Social Connections: Not on file  Intimate Partner Violence: Not on file   Family History:  Family History  Problem Relation Age of Onset   Bladder Cancer Mother    Diabetes type II Maternal Grandmother        "heart issues"   Peripheral vascular disease Maternal Grandmother    Coronary artery disease Other    Diabetes Other    Hyperlipidemia Other    Stroke Other     Review of Systems: Constitutional: Doesn't report fevers, chills or abnormal weight loss Eyes: Doesn't report blurriness of vision Ears, nose, mouth, throat, and face: Doesn't report sore throat Respiratory: Doesn't report cough, dyspnea or wheezes Cardiovascular: Doesn't report palpitation, chest discomfort  Gastrointestinal:  Doesn't report nausea, constipation, diarrhea GU: Doesn't report incontinence Skin: Doesn't report skin  rashes Neurological: Per HPI Musculoskeletal: Doesn't report joint pain Behavioral/Psych: Doesn't report anxiety  Physical Exam: There were no vitals filed for this visit.  KPS: 90. General: Alert, cooperative, pleasant, in no acute distress Head: Normal EENT: No conjunctival injection or scleral icterus.  Lungs: Resp effort normal Cardiac: Regular rate Abdomen: Non-distended abdomen Skin: No rashes cyanosis or petechiae. Extremities: No clubbing or edema  Neurologic Exam: Mental Status: Awake, alert, attentive to examiner. Oriented to self and environment. Language is fluent with intact comprehension.  Cranial Nerves: Visual acuity is grossly normal. Visual fields are full. Extra-ocular movements intact. No ptosis. Face is symmetric Motor: Tone and bulk are normal. Power is full in both arms and legs. Reflexes are symmetric, no pathologic reflexes present.  Sensory: Intact to light touch Gait: Normal.   Labs: I have reviewed the data as listed    Component  Value Date/Time   NA 137 10/11/2022 1100   K 4.0 10/11/2022 1100   CL 104 10/11/2022 1100   CO2 26 10/11/2022 1100   GLUCOSE 91 10/11/2022 1100   BUN 15 10/11/2022 1100   CREATININE 0.76 10/11/2022 1100   CALCIUM 9.5 10/11/2022 1100   PROT 7.7 10/11/2022 1100   ALBUMIN 4.5 10/11/2022 1100   AST 22 10/11/2022 1100   ALT 34 10/11/2022 1100   ALKPHOS 73 10/11/2022 1100   BILITOT 0.4 10/11/2022 1100   GFRNONAA >60 10/11/2022 1100   Lab Results  Component Value Date   WBC 6.3 10/11/2022   NEUTROABS 4.4 10/11/2022   HGB 13.9 10/11/2022   HCT 39.3 10/11/2022   MCV 89.9 10/11/2022   PLT 189 10/11/2022    Imaging:  [place] Clinician Interpretation: I have personally reviewed the CNS images as listed.  My interpretation, in the context of the patient's clinical presentation, is stable disease  MR BRAIN W WO CONTRAST  Result Date: 11/05/2022 CLINICAL DATA:  32 year old female WHO grade II diffuse astrocytoma, status  post resection at [facility] 2019. Imaging evidence of progression now status post chemo radiation September through November 2022. And ongoing chemotherapy since December that year. Restaging. EXAM: MRI HEAD WITHOUT AND WITH CONTRAST TECHNIQUE: Multiplanar, multiecho pulse sequences of the brain and surrounding structures were obtained without and with intravenous contrast. CONTRAST:  38m GADAVIST GADOBUTROL 1 MMOL/ML IV SOLN COMPARISON:  09/09/2022 and earlier. FINDINGS: Brain: Chronic left insula/operculum region resection cavity with stable size and configuration. Mild ex vacuo enlargement of the left lateral ventricle. Regional T2 and FLAIR hyperintensity, plus cortical enlargement at the inferior frontal gyrus, stable since 07/15/2022. Following contrast no abnormal enhancement identified, aside from stable thin dural postoperative changes. The punctate area of medial resection cavity enhancement seen on 07/15/2022 does not persist. No midline shift or new intracranial mass effect. No restricted diffusion suggestive of acute infarction. No ventriculomegaly or acute intracranial hemorrhage. Cervicomedullary junction and pituitary are within normal limits. Resection cavity associated hemosiderin is stable. No new signal abnormality identified in the brain. Vascular: Major intracranial vascular flow voids are stable. Following contrast major dural venous sinuses are enhancing and appear patent. Skull and upper cervical spine: Prior craniotomy. Visualized bone marrow signal is within normal limits. Negative visible cervical spine, spinal cord. Sinuses/Orbits: Stable, negative. Other: Mastoids remain clear. Visible internal auditory structures appear normal. Stable scalp soft tissues. IMPRESSION: 1. Stable size and extent of left hemisphere infiltrating tumor since 07/15/2022. No enhancement. 2. No new intracranial abnormality. Electronically Signed   By: HGenevie AnnM.D.   On: 11/05/2022 13:27    Assessment/Plan [place]rocytoma ([place]  Seizures ([place]  Stacy BROPHYis clinically stable today, now having completed cycle #12 of 5-day Temodar.  No clinical changes today, back pain is non localizing.  We recommended transitioning to imaging surveillance at this time.  For seizures will con't Keppra '1500mg'$  BID.  May dose sumatriptan, NSAIDs or Tylenol for breakthrough migraines.  We ask that Stacy Finesreturn to clinic as needed, [facility].  All questions were answered. The patient knows to call the clinic with any problems, questions or concerns. No barriers to learning were detected.  The total time spent in the encounter was 30 minutes and more than 50% was on counseling and review of test results   ZVentura Sellers MD Medical Director of Neuro-Oncology [facility]at WMobile01/16/24 11:38 AM

## 2022-11-15 ENCOUNTER — Other Ambulatory Visit: Payer: BC Managed Care – PPO

## 2022-11-15 ENCOUNTER — Ambulatory Visit: Payer: BC Managed Care – PPO | Admitting: Internal Medicine

## 2022-11-21 ENCOUNTER — Encounter: Payer: Self-pay | Admitting: Family Medicine

## 2022-11-21 ENCOUNTER — Ambulatory Visit (HOSPITAL_BASED_OUTPATIENT_CLINIC_OR_DEPARTMENT_OTHER)
Admission: RE | Admit: 2022-11-21 | Discharge: 2022-11-21 | Disposition: A | Discharge status: Home / Self Care | Payer: BC Managed Care – PPO | Source: Ambulatory Visit | Attending: Family Medicine | Admitting: Family Medicine

## 2022-11-21 ENCOUNTER — Ambulatory Visit: Payer: BC Managed Care – PPO | Admitting: Family Medicine

## 2022-11-21 VITALS — BP 124/68 | Ht 65.0 in | Wt 190.0 lb

## 2022-11-21 DIAGNOSIS — S39012A Strain of muscle, fascia and tendon of lower back, initial encounter: Secondary | ICD-10-CM | POA: Insufficient documentation

## 2022-11-21 DIAGNOSIS — M545 Low back pain, unspecified: Secondary | ICD-10-CM | POA: Diagnosis not present

## 2022-11-21 NOTE — Patient Instructions (Signed)
Nice to meet you Please try heat  Please try the exercises  We'll call with the xray results.   Please send me a message in MyChart with any questions or updates.  Please see me back in 4 weeks.   --Dr. Raeford Razor

## 2022-11-21 NOTE — Progress Notes (Signed)
  Stacy Gutierrez - 32 y.o. female MRN [id number]  Date of birth: Sep 07, 1991  SUBJECTIVE:  Including CC & ROS.  No chief complaint on file.   Stacy Gutierrez is a 32 y.o. female that is presenting with acute right-sided low back pain.  The pain seems to occur more at night and loosen up through the course of the day.  She has been getting treated for astrocytoma over the past year.  No history of back surgery.    Review of Systems See HPI   HISTORY: Past Medical, Surgical, Social, and Family History Reviewed & Updated per EMR.   Pertinent Historical Findings include:  Past Medical History:  Diagnosis Date   Astrocytoma ([place])    Mononucleosis 01/22/2010   Seizures ([place])    Vasovagal syncope     Past Surgical History:  Procedure Laterality Date   CRANIOTOMY Left    TIBIA FRACTURE SURGERY  03/24/2010   Tibial Fracture-left leg--plate/screw--Dr. Linton Rump   TONSILLECTOMY     TYMPANOSTOMY TUBE PLACEMENT Left 08/26/2021     PHYSICAL EXAM:  VS: BP 124/68   Ht '5\' 5"'$  (1.651 m)   Wt 190 lb (86.2 kg)   BMI 31.62 kg/m  Physical Exam Gen: NAD, alert, cooperative with exam, well-appearing MSK:  Neurovascularly intact       ASSESSMENT & PLAN:   Strain of lumbar region Acutely occurring over the past months.  Has good range of motion.  The right leg is slightly longer than the left.  Seems less concerning for stress fracture but had a long duration of chemotherapy and prednisone. -Counseled on home exercise therapy and supportive care. -Lumbar x-ray. -Could consider physical therapy

## 2022-11-21 NOTE — Assessment & Plan Note (Signed)
Acutely occurring over the past months.  Has good range of motion.  The right leg is slightly longer than the left.  Seems less concerning for stress fracture but had a long duration of chemotherapy and prednisone. -Counseled on home exercise therapy and supportive care. -Lumbar x-ray. -Could consider physical therapy

## 2022-11-22 ENCOUNTER — Encounter: Payer: Self-pay | Admitting: Internal Medicine

## 2022-11-22 ENCOUNTER — Other Ambulatory Visit: Payer: Self-pay

## 2022-11-22 ENCOUNTER — Telehealth: Payer: Self-pay | Admitting: Family Medicine

## 2022-11-22 DIAGNOSIS — M546 Pain in thoracic spine: Secondary | ICD-10-CM

## 2022-11-22 DIAGNOSIS — C719 Malignant neoplasm of brain, unspecified: Secondary | ICD-10-CM

## 2022-11-22 NOTE — Telephone Encounter (Signed)
Informed of results.   Rosemarie Ax, MD [facility] 11/22/2022, 3:54 PM

## 2022-11-28 MED ORDER — LORAZEPAM 1 MG PO TABS: 1.0000 mg | ORAL_TABLET | Freq: Every day | ORAL | 0 refills | Status: DC | PRN

## 2022-11-28 NOTE — Addendum Note (Signed)
Addended by: Ventura Sellers on: 11/28/2022 04:49 PM   Modules accepted: Orders

## 2022-12-01 ENCOUNTER — Ambulatory Visit (HOSPITAL_COMMUNITY)
Admission: RE | Admit: 2022-12-01 | Discharge: 2022-12-01 | Disposition: A | Discharge status: Home / Self Care | Payer: BC Managed Care – PPO | Source: Ambulatory Visit | Attending: Internal Medicine | Admitting: Internal Medicine

## 2022-12-01 ENCOUNTER — Encounter: Payer: Self-pay | Admitting: Family Medicine

## 2022-12-01 ENCOUNTER — Encounter: Payer: Self-pay | Admitting: Internal Medicine

## 2022-12-01 DIAGNOSIS — M50222 Other cervical disc displacement at C5-C6 level: Secondary | ICD-10-CM | POA: Diagnosis not present

## 2022-12-01 DIAGNOSIS — C719 Malignant neoplasm of brain, unspecified: Secondary | ICD-10-CM | POA: Diagnosis not present

## 2022-12-01 DIAGNOSIS — M5127 Other intervertebral disc displacement, lumbosacral region: Secondary | ICD-10-CM | POA: Diagnosis not present

## 2022-12-01 DIAGNOSIS — M546 Pain in thoracic spine: Secondary | ICD-10-CM

## 2022-12-01 DIAGNOSIS — M48061 Spinal stenosis, lumbar region without neurogenic claudication: Secondary | ICD-10-CM | POA: Diagnosis not present

## 2022-12-01 DIAGNOSIS — M545 Low back pain, unspecified: Secondary | ICD-10-CM | POA: Diagnosis not present

## 2022-12-01 DIAGNOSIS — M4802 Spinal stenosis, cervical region: Secondary | ICD-10-CM | POA: Diagnosis not present

## 2022-12-01 MED ORDER — GADOBUTROL 1 MMOL/ML IV SOLN
8.0000 mL | Freq: Once | INTRAVENOUS | Status: AC | PRN
  Administered 2022-12-01: 8 mL via INTRAVENOUS

## 2022-12-05 ENCOUNTER — Encounter: Payer: Self-pay | Admitting: Family Medicine

## 2022-12-05 ENCOUNTER — Telehealth (INDEPENDENT_AMBULATORY_CARE_PROVIDER_SITE_OTHER): Payer: BC Managed Care – PPO | Admitting: Family Medicine

## 2022-12-05 ENCOUNTER — Inpatient Hospital Stay: Payer: BC Managed Care – PPO | Attending: Internal Medicine | Admitting: Internal Medicine

## 2022-12-05 VITALS — Ht 65.0 in | Wt 190.0 lb

## 2022-12-05 DIAGNOSIS — C719 Malignant neoplasm of brain, unspecified: Secondary | ICD-10-CM

## 2022-12-05 DIAGNOSIS — R569 Unspecified convulsions: Secondary | ICD-10-CM | POA: Diagnosis not present

## 2022-12-05 DIAGNOSIS — S39012D Strain of muscle, fascia and tendon of lower back, subsequent encounter: Secondary | ICD-10-CM

## 2022-12-05 NOTE — Progress Notes (Signed)
Virtual Visit via Video Note  I connected with Stacy Gutierrez on 12/05/22 at  8:00 AM EST by a video enabled telemedicine application and verified that I am speaking with the correct person using two identifiers.  Location: Patient: home Provider: office   I discussed the limitations of evaluation and management by telemedicine and the availability of in person appointments. The patient expressed understanding and agreed to proceed.  History of Present Illness:  Stacy Gutierrez is a 32 year old female that is following up after the MRI of her spine.  She is having more right lower sided back pain.  The MRI was showing a potential bulge at the L5-S1.   Observations/Objective:   Assessment and Plan:  Lumbar strain/lumbar radiculopathy: May have a component of a radicular pain as to the source of her portion. -Counseled on home exercise therapy and supportive care. -Referral to therapy. -Could consider facet injection or epidural.  Follow Up Instructions:    I discussed the assessment and treatment plan with the patient. The patient was provided an opportunity to ask questions and all were answered. The patient agreed with the plan and demonstrated an understanding of the instructions.   The patient was advised to call back or seek an in-person evaluation if the symptoms worsen or if the condition fails to improve as anticipated.    Clearance Coots, MD

## 2022-12-05 NOTE — Assessment & Plan Note (Signed)
May have a component of a radicular pain as to the source of her portion. -Counseled on home exercise therapy and supportive care. -Referral to therapy. -Could consider facet injection or epidural.

## 2022-12-05 NOTE — Progress Notes (Signed)
I connected with Stacy Gutierrez on 12/05/22 at 11:00 AM EST by telephone visit and verified that I am speaking with the correct person using two identifiers.  I discussed the limitations, risks, security and privacy concerns of performing an evaluation and management service by telemedicine and the availability of in-person appointments. I also discussed with the patient that there may be a patient responsible charge related to this service. The patient expressed understanding and agreed to proceed.  Other persons participating in the visit and their role in the encounter:  n/a  Patient's location:  Home Provider's location:  Office Chief Complaint:  Astrocytoma ([place])  Seizures ([place])  History of Present Ilness: Stacy Gutierrez reports improvement in back pain symptoms.  She started sleeping on her right side which led to reduction in pain, overall symptoms.  No new or progressive complaints.  Observations: Language and cognition at baseline  Imaging:  MR TOTAL SPINE METS SCREENING  Result Date: 12/01/2022 CLINICAL DATA:  Metastatic disease evaluation. 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 New onset right lower back pain. EXAM: MRI TOTAL SPINE WITHOUT AND WITH CONTRAST TECHNIQUE: Multisequence MR imaging of the spine from the cervical spine to the sacrum was performed prior to and following IV contrast administration for evaluation of spinal metastatic disease. CONTRAST:  92m GADAVIST GADOBUTROL 1 MMOL/ML IV SOLN COMPARISON:  Lumbar spine radiographs 11/21/2022. FINDINGS: MRI CERVICAL SPINE FINDINGS Alignment: Cervical spine straightening.  No listhesis. Vertebrae: No fracture, suspicious marrow lesion, or significant marrow edema. Cord: Normal cord signal. No abnormal intradural enhancement identified within limitations of artifact through the C1-2 level on sagittal T1 postcontrast images. Posterior Fossa, vertebral arteries, paraspinal tissues: Unremarkable. Disc levels: Broad central  disc protrusion with annular fissure at C5-6, incompletely evaluated given lack of axial images through this level though with evidence of mild spinal stenosis without cord compression. MRI THORACIC SPINE FINDINGS Alignment:  Normal. Vertebrae: 1 cm heterogeneously STIR hyperintense focus in the T5 vertebral body which also demonstrates some intrinsic T1 hyperintensity and enhancement, consistent with a mildly atypical hemangioma. No suspicious marrow lesion, fracture, or significant marrow edema. Cord:  Normal cord signal.  No abnormal intradural enhancement. Paraspinal and other soft tissues: Unremarkable. Disc levels: Unremarkable. MRI LUMBAR SPINE FINDINGS Segmentation:  Standard. Alignment:  Normal. Vertebrae: No fracture, suspicious marrow lesion, or significant marrow edema in the lumbar spine. 1.5 cm STIR hyperintense focus in the S4 segment and a similar subcentimeter focus in the S5 segment, nonspecific but without worrisome enhancement and favored to be benign. Conus medullaris: Extends to the L1 level and appears normal. No abnormal enhancement identified along the cauda equina. Paraspinal and other soft tissues: Unremarkable. Disc levels: At L5-S1, there is disc desiccation and a central disc protrusion with enhancing annular fissure. No evidence of significant stenosis allowing for lack of axial imaging through this region. IMPRESSION: 1. No evidence of metastatic disease in the cervical, thoracic, or lumbar spine. 2. Central disc protrusion at C5-6 with mild spinal stenosis. 3. Central disc protrusion at L5-S1 without significant stenosis. Electronically Signed   By: ALogan BoresM.D.   On: 12/01/2022 14:28   DG Lumbar Spine 2-3 Views  Result Date: 11/21/2022 CLINICAL DATA:  Pain EXAM: LUMBAR SPINE - 3 VIEW COMPARISON:  None Available. FINDINGS: There is no evidence of lumbar spine fracture. Alignment is normal. Intervertebral disc spaces are maintained. IMPRESSION: Negative. Electronically Signed    By: JSammie BenchM.D.   On: 11/21/2022 20:46     Assessment  and Plan: Astrocytoma ([place])  Seizures ([place])  No evidence of leptomeningeal disease or other oncologic process in the spine.  Small hemangioma is noted, unlikely related to recent pain copmlaints.    Will con't to follow with sports medicine  Follow Up Instructions: RTC following [facility] eval as discussed  I discussed the assessment and treatment plan with the patient.  The patient was provided an opportunity to ask questions and all were answered.  The patient agreed with the plan and demonstrated understanding of the instructions.    The patient was advised to call back or seek an in-person evaluation if the symptoms worsen or if the condition fails to improve as anticipated.    Ventura Sellers, MD   I provided 15 minutes of non face-to-face telephone visit time during this encounter, and > 50% was spent counseling as documented under my assessment & plan.

## 2022-12-12 DIAGNOSIS — M545 Low back pain, unspecified: Secondary | ICD-10-CM | POA: Diagnosis not present

## 2022-12-15 DIAGNOSIS — M545 Low back pain, unspecified: Secondary | ICD-10-CM | POA: Diagnosis not present

## 2022-12-19 ENCOUNTER — Ambulatory Visit: Payer: BC Managed Care – PPO | Admitting: Family Medicine

## 2022-12-20 DIAGNOSIS — M545 Low back pain, unspecified: Secondary | ICD-10-CM | POA: Diagnosis not present

## 2022-12-22 DIAGNOSIS — M545 Low back pain, unspecified: Secondary | ICD-10-CM | POA: Diagnosis not present

## 2022-12-27 DIAGNOSIS — M545 Low back pain, unspecified: Secondary | ICD-10-CM | POA: Diagnosis not present

## 2022-12-28 DIAGNOSIS — Z3169 Encounter for other general counseling and advice on procreation: Secondary | ICD-10-CM | POA: Diagnosis not present

## 2022-12-29 DIAGNOSIS — M545 Low back pain, unspecified: Secondary | ICD-10-CM | POA: Diagnosis not present

## 2023-01-10 DIAGNOSIS — C719 Malignant neoplasm of brain, unspecified: Secondary | ICD-10-CM | POA: Diagnosis not present

## 2023-01-10 DIAGNOSIS — R569 Unspecified convulsions: Secondary | ICD-10-CM | POA: Diagnosis not present

## 2023-01-11 ENCOUNTER — Encounter: Payer: Self-pay | Admitting: Internal Medicine

## 2023-01-12 DIAGNOSIS — M545 Low back pain, unspecified: Secondary | ICD-10-CM | POA: Diagnosis not present

## 2023-01-17 DIAGNOSIS — M545 Low back pain, unspecified: Secondary | ICD-10-CM | POA: Diagnosis not present

## 2023-01-23 ENCOUNTER — Other Ambulatory Visit: Payer: Self-pay | Admitting: *Deleted

## 2023-01-23 ENCOUNTER — Encounter: Payer: Self-pay | Admitting: *Deleted

## 2023-01-23 DIAGNOSIS — C719 Malignant neoplasm of brain, unspecified: Secondary | ICD-10-CM

## 2023-01-24 ENCOUNTER — Telehealth: Payer: Self-pay | Admitting: Internal Medicine

## 2023-01-24 NOTE — Telephone Encounter (Signed)
Called patient per 4/2 IB message. Patient scheduled and notified.

## 2023-01-26 ENCOUNTER — Other Ambulatory Visit: Payer: Self-pay

## 2023-01-30 ENCOUNTER — Telehealth: Payer: Self-pay | Admitting: Internal Medicine

## 2023-01-30 DIAGNOSIS — M545 Low back pain, unspecified: Secondary | ICD-10-CM | POA: Diagnosis not present

## 2023-01-30 NOTE — Telephone Encounter (Signed)
patient called to reschedule her appointment.

## 2023-01-31 ENCOUNTER — Other Ambulatory Visit: Payer: Self-pay

## 2023-01-31 ENCOUNTER — Other Ambulatory Visit: Payer: Self-pay | Admitting: *Deleted

## 2023-01-31 DIAGNOSIS — R569 Unspecified convulsions: Secondary | ICD-10-CM

## 2023-01-31 MED ORDER — LEVETIRACETAM 500 MG PO TABS: 1500.0000 mg | ORAL_TABLET | Freq: Two times a day (BID) | ORAL | 3 refills | Status: DC

## 2023-02-02 DIAGNOSIS — M545 Low back pain, unspecified: Secondary | ICD-10-CM | POA: Diagnosis not present

## 2023-02-06 ENCOUNTER — Encounter: Payer: Self-pay | Admitting: *Deleted

## 2023-02-06 DIAGNOSIS — M545 Low back pain, unspecified: Secondary | ICD-10-CM | POA: Diagnosis not present

## 2023-02-08 DIAGNOSIS — M545 Low back pain, unspecified: Secondary | ICD-10-CM | POA: Diagnosis not present

## 2023-02-13 DIAGNOSIS — M545 Low back pain, unspecified: Secondary | ICD-10-CM | POA: Diagnosis not present

## 2023-02-14 DIAGNOSIS — M545 Low back pain, unspecified: Secondary | ICD-10-CM | POA: Diagnosis not present

## 2023-02-15 DIAGNOSIS — M545 Low back pain, unspecified: Secondary | ICD-10-CM | POA: Diagnosis not present

## 2023-02-20 DIAGNOSIS — M545 Low back pain, unspecified: Secondary | ICD-10-CM | POA: Diagnosis not present

## 2023-02-23 DIAGNOSIS — M545 Low back pain, unspecified: Secondary | ICD-10-CM | POA: Diagnosis not present

## 2023-02-27 DIAGNOSIS — M545 Low back pain, unspecified: Secondary | ICD-10-CM | POA: Diagnosis not present

## 2023-02-28 ENCOUNTER — Other Ambulatory Visit (HOSPITAL_COMMUNITY): Payer: Self-pay

## 2023-02-28 ENCOUNTER — Encounter: Payer: Self-pay | Admitting: Family Medicine

## 2023-02-28 ENCOUNTER — Ambulatory Visit: Payer: BC Managed Care – PPO | Admitting: Family Medicine

## 2023-02-28 VITALS — BP 104/74 | Ht 65.0 in | Wt 190.0 lb

## 2023-02-28 DIAGNOSIS — M47817 Spondylosis without myelopathy or radiculopathy, lumbosacral region: Secondary | ICD-10-CM | POA: Diagnosis not present

## 2023-02-28 MED ORDER — DIAZEPAM 5 MG PO TABS: ORAL_TABLET | ORAL | 0 refills | Status: DC

## 2023-02-28 MED ORDER — DIAZEPAM 5 MG PO TABS
ORAL_TABLET | ORAL | 0 refills | Status: DC
  Filled 2023-02-28: qty 2, 1d supply, fill #0

## 2023-02-28 NOTE — Addendum Note (Signed)
Addended by: Myra Rude on: 02/28/2023 02:59 PM   Modules accepted: Orders

## 2023-02-28 NOTE — Patient Instructions (Signed)
Good to see you Please continue heat  We've placed an order for the facet injections at [facility] imaging   Please send me a message in MyChart with any questions or updates.  Please give [place] a call back one week after the injections to update [place].   --Dr. Jordan Likes

## 2023-02-28 NOTE — Progress Notes (Signed)
  Stacy Gutierrez - 32 y.o. female MRN [id number]  Date of birth: 07-14-1991  SUBJECTIVE:  Including CC & ROS.  No chief complaint on file.   Stacy Gutierrez is a 32 y.o. female that is presenting with acute on chronic back pain.  This is right-sided in nature.  Localized to lower back.  It is worse in the morning.  Does get better through the course of the day.  She has been in physical therapy since January.  No radicular pain.    Review of Systems See HPI   HISTORY: Past Medical, Surgical, Social, and Family History Reviewed & Updated per EMR.   Pertinent Historical Findings include:  Past Medical History:  Diagnosis Date   Astrocytoma (HCC)    Mononucleosis 01/22/2010   Seizures (HCC)    Vasovagal syncope     Past Surgical History:  Procedure Laterality Date   CRANIOTOMY Left    TIBIA FRACTURE SURGERY  03/24/2010   Tibial Fracture-left leg--plate/screw--Dr. Samuel Bouche   TONSILLECTOMY     TYMPANOSTOMY TUBE PLACEMENT Left 08/26/2021     PHYSICAL EXAM:  VS: BP 104/74 (BP Location: Left Arm, Patient Position: Sitting)   Ht 5\' 5"  (1.651 m)   Wt 190 lb (86.2 kg)   BMI 31.62 kg/m  Physical Exam Gen: NAD, alert, cooperative with exam, well-appearing MSK:  Neurovascularly intact       ASSESSMENT & PLAN:   Facet arthropathy, lumbosacral Acute on chronic in nature.  Symptoms are mainly right-sided.  Localized to the lower back with no radicular pain. -Counseled on home exercise therapy and supportive care. -Pursue facet injections at L4-L5 and L5-S1. -Provided Valium for the procedure

## 2023-02-28 NOTE — Assessment & Plan Note (Signed)
Acute on chronic in nature.  Symptoms are mainly right-sided.  Localized to the lower back with no radicular pain. -Counseled on home exercise therapy and supportive care. -Pursue facet injections at L4-L5 and L5-S1. -Provided Valium for the procedure

## 2023-03-02 DIAGNOSIS — M545 Low back pain, unspecified: Secondary | ICD-10-CM | POA: Diagnosis not present

## 2023-03-07 DIAGNOSIS — M545 Low back pain, unspecified: Secondary | ICD-10-CM | POA: Diagnosis not present

## 2023-03-21 ENCOUNTER — Telehealth: Payer: Self-pay

## 2023-04-04 ENCOUNTER — Encounter: Payer: Self-pay | Admitting: Nurse Practitioner

## 2023-04-04 ENCOUNTER — Encounter: Payer: Self-pay | Admitting: Internal Medicine

## 2023-04-04 ENCOUNTER — Ambulatory Visit (INDEPENDENT_AMBULATORY_CARE_PROVIDER_SITE_OTHER): Payer: BC Managed Care – PPO | Admitting: Nurse Practitioner

## 2023-04-04 VITALS — BP 120/76 | HR 92 | Resp 20 | Ht 65.75 in | Wt 197.2 lb

## 2023-04-04 DIAGNOSIS — Z01419 Encounter for gynecological examination (general) (routine) without abnormal findings: Secondary | ICD-10-CM | POA: Diagnosis not present

## 2023-04-04 DIAGNOSIS — Z30431 Encounter for routine checking of intrauterine contraceptive device: Secondary | ICD-10-CM

## 2023-04-04 DIAGNOSIS — Z13220 Encounter for screening for lipoid disorders: Secondary | ICD-10-CM

## 2023-04-04 NOTE — Progress Notes (Signed)
   Stacy Gutierrez [birth year] [id number]   History:  32 y.o. G0 presents for annual exam. Mirena IUD 06/2017. Normal pap history. Has received Gardasil series. Requesting lipid panel today. Not fasting. H/O brain tumor.   Gynecologic History No LMP recorded. (Menstrual status: IUD). Period Pattern: (!) Irregular (occassional spotting with IUD) Menstrual Flow: Light Dysmenorrhea: None Contraception/Family planning: IUD Sexually active: Yes  Health Maintenance Last Pap: 05/25/2021. Results were: Normal Last mammogram: Not indicated Last colonoscopy: Not indicated Last Dexa: Not indicated   Past medical history, past surgical history, family history and social history were all reviewed and documented in the EPIC chart. 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  ROS:  A ROS was performed and pertinent positives and negatives are included.  Exam:  Vitals:   04/04/23 1057  BP: 120/76  Pulse: 92  Resp: 20  SpO2: 99%  Weight: 197 lb 3.2 oz (89.4 kg)  Height: 5' 5.75" (1.67 m)    Body mass index is 32.07 kg/m.  General appearance:  Normal Thyroid:  Symmetrical, normal in size, without palpable masses or nodularity. Respiratory  Auscultation:  Clear without wheezing or rhonchi Cardiovascular  Auscultation:  Regular rate, without rubs, murmurs or gallops  Edema/varicosities:  Not grossly evident Abdominal  Soft,nontender, without masses, guarding or rebound.  Liver/spleen:  No organomegaly noted  Hernia:  None appreciated  Skin  Inspection:  Grossly normal Breasts: Examined lying and sitting.   Right: Without masses, retractions, nipple discharge or axillary adenopathy.   Left: Without masses, retractions, nipple discharge or axillary adenopathy. Genitourinary   Inguinal/mons:  Normal without inguinal adenopathy  External genitalia:  Normal appearing vulva with no masses, tenderness, or lesions  BUS/Urethra/Skene's glands:  Normal  Vagina:  Normal appearing with normal color and  discharge, no lesions  Cervix:  Normal appearing without discharge or lesions. IUD string visible about 2 cm  Uterus:  Normal in size, shape and contour.  Midline and mobile, nontender  Adnexa/parametria:     Rt: Normal in size, without masses or tenderness.   Lt: Normal in size, without masses or tenderness.  Anus and perineum: Normal  Patient informed chaperone available to be present for breast and pelvic exam. Patient has requested no chaperone to be present. Patient has been advised what will be completed during breast and pelvic exam.   Assessment/Plan:  32 y.o. G0 for annual exam.   Well female exam with routine gynecological exam - Education provided on SBEs, importance of preventative screenings, current guidelines, high calcium diet, regular exercise, and multivitamin daily. CMB, CMP with oncology. Will return for fasting lipid panel.   Screening for lipid disorders - Plan: Lipid panel  Encounter for routine checking of intrauterine contraceptive device (IUD) - Mirena IUD inserted 06/2017. She is aware of 8-year FDA approval.   Screening for cervical cancer - Normal Pap history.  Will repeat at 3-year interval per guidelines.    Return in 1 year for annual.     Olivia Mackie DNP, 11:18 AM 04/04/2023

## 2023-04-05 ENCOUNTER — Ambulatory Visit (HOSPITAL_COMMUNITY)
Admission: RE | Admit: 2023-04-05 | Discharge: 2023-04-05 | Disposition: A | Discharge status: Home / Self Care | Payer: BC Managed Care – PPO | Source: Ambulatory Visit | Attending: Internal Medicine | Admitting: Internal Medicine

## 2023-04-05 DIAGNOSIS — C729 Malignant neoplasm of central nervous system, unspecified: Secondary | ICD-10-CM | POA: Diagnosis not present

## 2023-04-05 DIAGNOSIS — C719 Malignant neoplasm of brain, unspecified: Secondary | ICD-10-CM

## 2023-04-05 MED ORDER — GADOBUTROL 1 MMOL/ML IV SOLN
9.0000 mL | Freq: Once | INTRAVENOUS | Status: AC | PRN
  Administered 2023-04-05: 9 mL via INTRAVENOUS

## 2023-04-07 ENCOUNTER — Other Ambulatory Visit: Payer: BC Managed Care – PPO

## 2023-04-11 ENCOUNTER — Other Ambulatory Visit (HOSPITAL_COMMUNITY): Payer: BC Managed Care – PPO

## 2023-04-13 ENCOUNTER — Other Ambulatory Visit: Payer: Self-pay

## 2023-04-13 ENCOUNTER — Other Ambulatory Visit: Payer: BC Managed Care – PPO

## 2023-04-13 ENCOUNTER — Inpatient Hospital Stay: Payer: BC Managed Care – PPO | Attending: Internal Medicine | Admitting: Internal Medicine

## 2023-04-13 VITALS — BP 125/82 | HR 105 | Temp 98.3°F | Resp 20 | Wt 199.7 lb

## 2023-04-13 DIAGNOSIS — Z793 Long term (current) use of hormonal contraceptives: Secondary | ICD-10-CM | POA: Diagnosis not present

## 2023-04-13 DIAGNOSIS — R569 Unspecified convulsions: Secondary | ICD-10-CM

## 2023-04-13 DIAGNOSIS — Z13220 Encounter for screening for lipoid disorders: Secondary | ICD-10-CM | POA: Diagnosis not present

## 2023-04-13 DIAGNOSIS — Z923 Personal history of irradiation: Secondary | ICD-10-CM | POA: Insufficient documentation

## 2023-04-13 DIAGNOSIS — C712 Malignant neoplasm of temporal lobe: Secondary | ICD-10-CM | POA: Insufficient documentation

## 2023-04-13 DIAGNOSIS — C719 Malignant neoplasm of brain, unspecified: Secondary | ICD-10-CM

## 2023-04-13 DIAGNOSIS — Z79899 Other long term (current) drug therapy: Secondary | ICD-10-CM | POA: Diagnosis not present

## 2023-04-13 DIAGNOSIS — Z9221 Personal history of antineoplastic chemotherapy: Secondary | ICD-10-CM | POA: Diagnosis not present

## 2023-04-13 LAB — LIPID PANEL
Cholesterol: 232 mg/dL — ABNORMAL HIGH (ref <200)
HDL: 49 mg/dL — ABNORMAL LOW (ref >=50)
LDL Cholesterol (Calc): 148 mg/dL (calc) — ABNORMAL HIGH
Non-HDL Cholesterol (Calc): 183 mg/dL (calc) — ABNORMAL HIGH (ref <130)
Total CHOL/HDL Ratio: 4.7 (calc) (ref <5.0)
Triglycerides: 213 mg/dL — ABNORMAL HIGH (ref <150)

## 2023-04-13 NOTE — Progress Notes (Signed)
[facility] at [facility] [address]  [place], [place] [address] [phone]   Interval Evaluation  Date of Service: 04/13/23 Patient Name: Stacy Gutierrez Patient MRN: [id number] Patient DOB: Feb 07, 1991 Provider: Henreitta Leber, MD  Identifying Statement:  Stacy Gutierrez is a 32 y.o. 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 Zachery Conch at [facility]  Path demonstrates WHO 2 diffuse astrocytoma, IDH mt   06/12/2021 Progression   Progression of disease #1   07/09/2021 Procedure   Oocyte retrieval at [facility], prior to initiation of chemotherapy   07/21/2021 - 08/31/2021 Radiation Therapy   IMRT and concurrent Temodar with Dr. Basilio Cairo   10/14/2021 -  Chemotherapy   Patient is on Treatment Plan : BRAIN Low Grade Glioma Grade II, Glioblastoma,  Astrocytoma, Oligodendroma, Recurrent or Progressive / Temozolomide D1-5 Q28 Days      Biomarkers: MGMT Unknown.  IDH 1/2 Mutated.  EGFR Unknown  TERT Unmutated   Interval History: KARNA DOWNHOUR presents today for follow up following recent MRI brain.  No new or progressive changes.  No further seizures.  Fatigue still present.  Planning trip to [place] for [place] next month.  H+P (06/22/21) Patient presents today as referral from Dr. Noe Gens at [facility], for local radiation and chemotherapy for progressive glioma.  Patient describes breakthrough seizure episode earlier this month, [place] [facility].  Vimpat was poorly tolerated, so Lamictal titration was initiated.  She is currently up to 50mg  BID, with plans to increase to 100mg  BID. Also on Keppra 1500/2000.  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  [facility] team is recommending radiation and Temodar given recent  progression noted on MRI.  No exposure to RT or chemo since diagnosis in 2019.  Medications: Current Outpatient Medications on File Prior to Visit  Medication Sig Dispense Refill   levETIRAcetam (KEPPRA) 1000 MG tablet Take 1,000 mg by mouth 2 (two) times daily.     LORazepam (ATIVAN) 1 MG tablet Take 1 tablet (1 mg total) by mouth daily as needed. 30 tablet 0   levETIRAcetam (KEPPRA) 500 MG tablet Take 3 tablets (1,500 mg total) by mouth 2 (two) times daily. (Patient not taking: Reported on 04/13/2023) 180 tablet 3   levonorgestrel (MIRENA, 52 MG,) 20 MCG/DAY IUD 1 each by Intrauterine route once for 1 dose. 1 each 0   No current facility-administered medications on file prior to visit.    Allergies:  Allergies  Allergen Reactions   Lamotrigine Rash   Past Medical History:  Past Medical History:  Diagnosis Date   Astrocytoma (HCC)    Mononucleosis 01/22/2010   Seizures (HCC)    Vasovagal syncope    Past Surgical History:  Past Surgical History:  Procedure Laterality Date   CRANIOTOMY Left    TIBIA FRACTURE SURGERY  03/24/2010   Tibial Fracture-left leg--plate/screw--Dr. Samuel Bouche   TONSILLECTOMY     TYMPANOSTOMY TUBE PLACEMENT Left 08/26/2021   Social History:  Social History   Socioeconomic History   Marital status: Married    Spouse name: Not on file   Number of children: 0   Years of education: Not on file   Highest education level: Not on file  Occupational History   Occupation:  Student  Tobacco Use   Smoking status: Never   Smokeless tobacco: Never  Vaping Use   Vaping Use: Never used  Substance and Sexual Activity   Alcohol use: No   Drug use: Not on file   Sexual activity: Yes    Partners: Male    Birth control/protection: I.U.D.  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   Social Determinants of Health   Financial Resource Strain: Not on file   Food Insecurity: Not on file  Transportation Needs: Not on file  Physical Activity: Not on file  Stress: Not on file  Social Connections: Not on file  Intimate Partner Violence: Not on file   Family History:  Family History  Problem Relation Age of Onset   Bladder Cancer Mother    Diabetes type II Maternal Grandmother        "heart issues"   Peripheral vascular disease Maternal Grandmother    Coronary artery disease Other    Diabetes Other    Hyperlipidemia Other    Stroke Other     Review of Systems: Constitutional: Doesn't report fevers, chills or abnormal weight loss Eyes: Doesn't report blurriness of vision Ears, nose, mouth, throat, and face: Doesn't report sore throat Respiratory: Doesn't report cough, dyspnea or wheezes Cardiovascular: Doesn't report palpitation, chest discomfort  Gastrointestinal:  Doesn't report nausea, constipation, diarrhea GU: Doesn't report incontinence Skin: Doesn't report skin rashes Neurological: Per HPI Musculoskeletal: Doesn't report joint pain Behavioral/Psych: Doesn't report anxiety  Physical Exam: Vitals:   04/13/23 1043  BP: 125/82  Pulse: (!) 105  Resp: 20  Temp: 98.3 F (36.8 C)  SpO2: 98%    KPS: 90. General: Alert, cooperative, pleasant, in no acute distress Head: Normal EENT: No conjunctival injection or scleral icterus.  Lungs: Resp effort normal Cardiac: Regular rate Abdomen: Non-distended abdomen Skin: No rashes cyanosis or petechiae. Extremities: No clubbing or edema  Neurologic Exam: Mental Status: Awake, alert, attentive to examiner. Oriented to self and environment. Language is fluent with intact comprehension.  Cranial Nerves: Visual acuity is grossly normal. Visual fields are full. Extra-ocular movements intact. No ptosis. Face is symmetric Motor: Tone and bulk are normal. Power is full in both arms and legs. Reflexes are symmetric, no pathologic reflexes present.  Sensory: Intact to light touch Gait:  Normal.   Labs: I have reviewed the data as listed    Component Value Date/Time   NA 138 11/08/2022 1130   K 4.0 11/08/2022 1130   CL 104 11/08/2022 1130   CO2 27 11/08/2022 1130   GLUCOSE 74 11/08/2022 1130   BUN 18 11/08/2022 1130   CREATININE 0.65 11/08/2022 1130   CALCIUM 10.0 11/08/2022 1130   PROT 7.5 11/08/2022 1130   ALBUMIN 4.6 11/08/2022 1130   AST 20 11/08/2022 1130   ALT 32 11/08/2022 1130   ALKPHOS 69 11/08/2022 1130   BILITOT 0.3 11/08/2022 1130   GFRNONAA >60 11/08/2022 1130   Lab Results  Component Value Date   WBC 5.6 11/08/2022   NEUTROABS 3.6 11/08/2022   HGB 13.8 11/08/2022   HCT 38.7 11/08/2022   MCV 89.0 11/08/2022   PLT 231 11/08/2022    Imaging:  [facility] Clinician Interpretation: I have personally reviewed the CNS images as listed.  My interpretation, in the context of the patient's clinical presentation, is stable disease  MR Brain W Wo Contrast  Result  Date: 04/11/2023 CLINICAL DATA:  Provided history: Astrocytoma. Brain/CNS neoplasm, assess treatment response. Additional history obtained from prior radiology records: History of grade III diffuse astrocytoma. EXAM: MRI HEAD WITHOUT AND WITH CONTRAST TECHNIQUE: Multiplanar, multiecho pulse sequences of the brain and surrounding structures were obtained without and with intravenous contrast. CONTRAST:  9mL GADAVIST GADOBUTROL 1 MMOL/ML IV SOLN COMPARISON:  Prior brain MRI examinations 11/04/2022 and earlier. FINDINGS: Brain: No age advanced or lobar predominant parenchymal atrophy. Redemonstrated resection cavity within the left frontal lobe (along the left insula) and within the anteromedial left temporal lobe. The resection cavity extends to the temporal horn of the left lateral ventricle. Unchanged chronic blood products along the resection cavity margins. T2 FLAIR hyperintense signal abnormality surrounding the resection cavity within the left frontal lobe, along the posterior left insula and within the  left temporal lobe, stable from the prior MRI of 11/04/2022. Superimposed gyral thickening at the inferior left frontal lobe and anterior left temporal lobe is also stable. No associated pathologic enhancement. There is no acute infarct. No extra-axial fluid collection. No midline shift. Vascular: Maintained flow voids within the proximal large arterial vessels. Skull and upper cervical spine: Left-sided cranioplasty. No focal suspicious marrow lesion. Sinuses/Orbits: No mass or acute finding within the imaged orbits. No mucosal thickening within the left ethmoid and left maxillary sinuses. IMPRESSION: Unchanged extent and appearance of the infiltrative tumor within the left cerebral hemisphere as compared to the prior brain MRI of 11/04/2022. No pathologic enhancement. Electronically Signed   By: Jackey Loge D.O.   On: 04/11/2023 15:03     Assessment/Plan Astrocytoma (HCC)  Seizures (HCC)  OTTIE GURECKI is clinically stable today.  MRI brain demonstrates stable findings.  We recommended continuing imaging surveillance at this time.  For seizures will con't Keppra 1000mg  BID, may be decreased to 750mg  BID following overseas trip.  May dose sumatriptan, NSAIDs or Tylenol for breakthrough migraines.  We ask that Bernadene Bell return to clinic as needed, per [facility] team.  All questions were answered. The patient knows to call the clinic with any problems, questions or concerns. No barriers to learning were detected.  The total time spent in the encounter was 30 minutes and more than 50% was on counseling and review of test results   Henreitta Leber, MD Medical Director of Neuro-Oncology [facility] [place] 04/13/23 2:42 PM

## 2023-04-14 ENCOUNTER — Other Ambulatory Visit: Payer: BC Managed Care – PPO

## 2023-04-17 ENCOUNTER — Ambulatory Visit: Payer: BC Managed Care – PPO | Admitting: Internal Medicine

## 2023-04-20 ENCOUNTER — Ambulatory Visit: Payer: BC Managed Care – PPO | Admitting: Internal Medicine

## 2023-04-25 ENCOUNTER — Other Ambulatory Visit: Payer: Self-pay | Admitting: Internal Medicine

## 2023-04-25 DIAGNOSIS — R569 Unspecified convulsions: Secondary | ICD-10-CM

## 2023-06-14 DIAGNOSIS — L814 Other melanin hyperpigmentation: Secondary | ICD-10-CM | POA: Diagnosis not present

## 2023-06-14 DIAGNOSIS — D2239 Melanocytic nevi of other parts of face: Secondary | ICD-10-CM | POA: Diagnosis not present

## 2023-06-14 DIAGNOSIS — L579 Skin changes due to chronic exposure to nonionizing radiation, unspecified: Secondary | ICD-10-CM | POA: Diagnosis not present

## 2023-06-14 DIAGNOSIS — D225 Melanocytic nevi of trunk: Secondary | ICD-10-CM | POA: Diagnosis not present

## 2023-06-14 DIAGNOSIS — D485 Neoplasm of uncertain behavior of skin: Secondary | ICD-10-CM | POA: Diagnosis not present

## 2023-07-18 DIAGNOSIS — C719 Malignant neoplasm of brain, unspecified: Secondary | ICD-10-CM | POA: Diagnosis not present

## 2023-07-18 DIAGNOSIS — Z923 Personal history of irradiation: Secondary | ICD-10-CM | POA: Diagnosis not present

## 2023-07-18 DIAGNOSIS — C718 Malignant neoplasm of overlapping sites of brain: Secondary | ICD-10-CM | POA: Diagnosis not present

## 2023-07-19 ENCOUNTER — Encounter: Payer: Self-pay | Admitting: Internal Medicine

## 2023-07-19 DIAGNOSIS — L820 Inflamed seborrheic keratosis: Secondary | ICD-10-CM | POA: Diagnosis not present

## 2023-07-21 ENCOUNTER — Telehealth: Payer: Self-pay | Admitting: Internal Medicine

## 2023-07-22 ENCOUNTER — Other Ambulatory Visit: Payer: Self-pay

## 2023-07-25 ENCOUNTER — Other Ambulatory Visit: Payer: Self-pay

## 2023-07-25 ENCOUNTER — Other Ambulatory Visit: Payer: BC Managed Care – PPO

## 2023-07-25 ENCOUNTER — Ambulatory Visit: Payer: BC Managed Care – PPO | Admitting: Internal Medicine

## 2023-07-31 ENCOUNTER — Inpatient Hospital Stay: Payer: BC Managed Care – PPO | Attending: Internal Medicine | Admitting: Internal Medicine

## 2023-07-31 ENCOUNTER — Other Ambulatory Visit: Payer: Self-pay

## 2023-07-31 ENCOUNTER — Inpatient Hospital Stay: Payer: BC Managed Care – PPO

## 2023-07-31 VITALS — BP 133/88 | HR 72 | Temp 98.0°F | Resp 14 | Ht 65.75 in | Wt 197.9 lb

## 2023-07-31 DIAGNOSIS — C719 Malignant neoplasm of brain, unspecified: Secondary | ICD-10-CM

## 2023-07-31 DIAGNOSIS — C712 Malignant neoplasm of temporal lobe: Secondary | ICD-10-CM | POA: Diagnosis not present

## 2023-07-31 DIAGNOSIS — Z79899 Other long term (current) drug therapy: Secondary | ICD-10-CM | POA: Insufficient documentation

## 2023-07-31 DIAGNOSIS — R569 Unspecified convulsions: Secondary | ICD-10-CM

## 2023-07-31 DIAGNOSIS — R5383 Other fatigue: Secondary | ICD-10-CM | POA: Insufficient documentation

## 2023-07-31 LAB — CMP (CANCER CENTER ONLY)
ALT: 50 U/L — ABNORMAL HIGH (ref 0–44)
AST: 29 U/L (ref 15–41)
Albumin: 4.7 g/dL (ref 3.5–5.0)
Alkaline Phosphatase: 82 U/L (ref 38–126)
Anion gap: 8 (ref 5–15)
BUN: 13 mg/dL (ref 6–20)
CO2: 29 mmol/L (ref 22–32)
Calcium: 9.7 mg/dL (ref 8.9–10.3)
Chloride: 102 mmol/L (ref 98–111)
Creatinine: 0.76 mg/dL (ref 0.44–1.00)
GFR, Estimated: >60 mL/min (ref >60)
Glucose, Bld: 71 mg/dL (ref 70–99)
Potassium: 3.9 mmol/L (ref 3.5–5.1)
Sodium: 139 mmol/L (ref 135–145)
Total Bilirubin: 0.3 mg/dL (ref 0.3–1.2)
Total Protein: 7.5 g/dL (ref 6.5–8.1)

## 2023-07-31 LAB — CBC WITH DIFFERENTIAL (CANCER CENTER ONLY)
Abs Immature Granulocytes: 0.02 10*3/uL (ref 0.00–0.07)
Basophils Absolute: 0 10*3/uL (ref 0.0–0.1)
Basophils Relative: 1 %
Eosinophils Absolute: 0.2 10*3/uL (ref 0.0–0.5)
Eosinophils Relative: 2 %
HCT: 38.5 % (ref 36.0–46.0)
Hemoglobin: 13.5 g/dL (ref 12.0–15.0)
Immature Granulocytes: 0 %
Lymphocytes Relative: 28 %
Lymphs Abs: 1.9 10*3/uL (ref 0.7–4.0)
MCH: 30.8 pg (ref 26.0–34.0)
MCHC: 35.1 g/dL (ref 30.0–36.0)
MCV: 87.7 fL (ref 80.0–100.0)
Monocytes Absolute: 0.5 10*3/uL (ref 0.1–1.0)
Monocytes Relative: 8 %
Neutro Abs: 4.3 10*3/uL (ref 1.7–7.7)
Neutrophils Relative %: 61 %
Platelet Count: 243 10*3/uL (ref 150–400)
RBC: 4.39 MIL/uL (ref 3.87–5.11)
RDW: 11.4 % — ABNORMAL LOW (ref 11.5–15.5)
WBC Count: 7 10*3/uL (ref 4.0–10.5)
nRBC: 0 % (ref 0.0–0.2)

## 2023-07-31 MED ORDER — LEVETIRACETAM 500 MG PO TABS: 500.0000 mg | ORAL_TABLET | Freq: Two times a day (BID) | ORAL | Status: DC

## 2023-07-31 NOTE — Progress Notes (Signed)
[facility] at [facility] [address]  [place], [place] [address] [phone]   Interval Evaluation  Date of Service: 07/31/23 Patient Name: Stacy Gutierrez Patient MRN: [id number] Patient DOB: Mar 13, 1991 Provider: Henreitta Leber, MD  Identifying Statement:  Stacy Gutierrez is a 32 y.o. 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 Zachery Conch at [facility].  Path demonstrates WHO 2 diffuse astrocytoma, IDH mt   06/12/2021 Progression   Progression of disease #1   07/09/2021 Procedure   Oocyte retrieval at [facility], prior to initiation of chemotherapy   07/21/2021 - 08/31/2021 Radiation Therapy   IMRT and concurrent Temodar with Dr. Basilio Cairo   10/14/2021 -  Chemotherapy   Patient is on Treatment Plan : BRAIN Low Grade Glioma Grade II, Glioblastoma,  Astrocytoma, Oligodendroma, Recurrent or Progressive / Temozolomide D1-5 Q28 Days      Biomarkers: MGMT Unknown.  IDH 1/2 Mutated.  EGFR Unknown  TERT Unmutated   Interval History: Stacy Gutierrez presents today for follow up following recent MRI brain.  No new or progressive changes.  No further seizures.  Fatigue still present.  She had good trip to [place], [place] over the summer.    H+P (06/22/21) Patient presents today as referral from Dr. Noe Gens at [facility], for local radiation and chemotherapy for progressive glioma.  Patient describes breakthrough seizure episode earlier this month, [place] [facility]  Vimpat was poorly tolerated, so Lamictal titration was initiated.  She is currently up to 50mg  BID, with plans to increase to 100mg  BID. Also on Keppra 1500/2000.  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  [facility] team is recommending radiation and Temodar given  recent progression noted on MRI.  No exposure to RT or chemo since diagnosis in 2019.  Medications: Current Outpatient Medications on File Prior to Visit  Medication Sig Dispense Refill   levETIRAcetam (KEPPRA) 1000 MG tablet Take 1,000 mg by mouth 2 (two) times daily.     levETIRAcetam (KEPPRA) 500 MG tablet TAKE 3 TABLETS(1500 MG) BY MOUTH TWICE DAILY 540 tablet 0   levonorgestrel (MIRENA, 52 MG,) 20 MCG/DAY IUD 1 each by Intrauterine route once for 1 dose. 1 each 0   LORazepam (ATIVAN) 1 MG tablet Take 1 tablet (1 mg total) by mouth daily as needed. 30 tablet 0   No current facility-administered medications on file prior to visit.    Allergies:  Allergies  Allergen Reactions   Lamotrigine Rash   Past Medical History:  Past Medical History:  Diagnosis Date   Astrocytoma (HCC)    Mononucleosis 01/22/2010   Seizures (HCC)    Vasovagal syncope    Past Surgical History:  Past Surgical History:  Procedure Laterality Date   CRANIOTOMY Left    TIBIA FRACTURE SURGERY  03/24/2010   Tibial Fracture-left leg--plate/screw--Dr. Samuel Bouche   TONSILLECTOMY     TYMPANOSTOMY TUBE PLACEMENT Left 08/26/2021   Social History:  Social History   Socioeconomic History   Marital status: Married    Spouse name: Not on file   Number of children: 0   Years of education: Not on file   Highest education level: Not on file  Occupational History   Occupation: Consulting civil engineer  Tobacco Use  Smoking status: Never   Smokeless tobacco: Never  Vaping Use   Vaping status: Never Used  Substance and Sexual Activity   Alcohol use: No   Drug use: Not on file   Sexual activity: Yes    Partners: Male    Birth control/protection: I.U.D.  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   Social Determinants of Health   Financial Resource Strain: Not on file  Food Insecurity: Not on file  Transportation  Needs: No Transportation Needs (06/15/2021)   Received from [facility], [facility]   PRAPARE - Transportation    In the past 12 months, has lack of transportation kept you from medical appointments or from getting medications?: No    Lack of Transportation (Non-Medical): No  Physical Activity: Not on file  Stress: Not on file  Social Connections: Unknown (02/21/2022)   Received from [facility]   Social Network    Social Network: Not on file  Intimate Partner Violence: Unknown (01/27/2022)   Received from [facility], [facility]   HITS    Physically Hurt: Not on file    Insult or Talk Down To: Not on file    Threaten Physical Harm: Not on file    Scream or Curse: Not on file   Family History:  Family History  Problem Relation Age of Onset   Bladder Cancer Mother    Diabetes type II Maternal Grandmother        "heart issues"   Peripheral vascular disease Maternal Grandmother    Coronary artery disease Other    Diabetes Other    Hyperlipidemia Other    Stroke Other     Review of Systems: Constitutional: Doesn't report fevers, chills or abnormal weight loss Eyes: Doesn't report blurriness of vision Ears, nose, mouth, throat, and face: Doesn't report sore throat Respiratory: Doesn't report cough, dyspnea or wheezes Cardiovascular: Doesn't report palpitation, chest discomfort  Gastrointestinal:  Doesn't report nausea, constipation, diarrhea GU: Doesn't report incontinence Skin: Doesn't report skin rashes Neurological: Per HPI Musculoskeletal: Doesn't report joint pain Behavioral/Psych: Doesn't report anxiety  Physical Exam: Vitals:   07/31/23 1401  BP: 133/88  Pulse: 72  Resp: 14  Temp: 98 F (36.7 C)  SpO2: 100%     KPS: 90. General: Alert, cooperative, pleasant, in no acute distress Head: Normal EENT: No conjunctival injection or scleral icterus.  Lungs: Resp effort normal Cardiac: Regular rate Abdomen:  Non-distended abdomen Skin: No rashes cyanosis or petechiae. Extremities: No clubbing or edema  Neurologic Exam: Mental Status: Awake, alert, attentive to examiner. Oriented to self and environment. Language is fluent with intact comprehension.  Cranial Nerves: Visual acuity is grossly normal. Visual fields are full. Extra-ocular movements intact. No ptosis. Face is symmetric Motor: Tone and bulk are normal. Power is full in both arms and legs. Reflexes are symmetric, no pathologic reflexes present.  Sensory: Intact to light touch Gait: Normal.   Labs: I have reviewed the data as listed    Component Value Date/Time   NA 139 07/31/2023 1314   K 3.9 07/31/2023 1314   CL 102 07/31/2023 1314   CO2 29 07/31/2023 1314   GLUCOSE 71 07/31/2023 1314   BUN 13 07/31/2023 1314   CREATININE 0.76 07/31/2023 1314   CALCIUM 9.7 07/31/2023 1314   PROT 7.5 07/31/2023 1314   ALBUMIN 4.7 07/31/2023  1314   AST 29 07/31/2023 1314   ALT 50 (H) 07/31/2023 1314   ALKPHOS 82 07/31/2023 1314   BILITOT 0.3 07/31/2023 1314   GFRNONAA >60 07/31/2023 1314   Lab Results  Component Value Date   WBC 7.0 07/31/2023   NEUTROABS 4.3 07/31/2023   HGB 13.5 07/31/2023   HCT 38.5 07/31/2023   MCV 87.7 07/31/2023   PLT 243 07/31/2023    Imaging:  [facility] Clinician Interpretation: I have personally reviewed the CNS images as listed.  My interpretation, in the context of the patient's clinical presentation, is stable disease (outside study)  No results found.   Assessment/Plan Astrocytoma (HCC)  Seizures (HCC)  Stacy Gutierrez is clinically stable today.  MRI brain demonstrates stable findings.  We recommended continuing imaging surveillance at this time.  For seizures may try reducing Keppra to 500mg  BID given long term seizure control, fatigue issues.  May dose sumatriptan, NSAIDs or Tylenol for breakthrough migraines.  We ask that Stacy Gutierrez return to clinic in 3 months with MRI brain for review.   Next [facility] visit in 6 months.  All questions were answered. The patient knows to call the clinic with any problems, questions or concerns. No barriers to learning were detected.  The total time spent in the encounter was 30 minutes and more than 50% was on counseling and review of test results   Henreitta Leber, MD Medical Director of Neuro-Oncology [facility] 07/31/23 2:02 PM

## 2023-08-01 ENCOUNTER — Other Ambulatory Visit: Payer: Self-pay

## 2023-10-02 DIAGNOSIS — Z3169 Encounter for other general counseling and advice on procreation: Secondary | ICD-10-CM | POA: Diagnosis not present

## 2023-10-19 ENCOUNTER — Other Ambulatory Visit: Payer: Self-pay | Admitting: *Deleted

## 2023-10-19 ENCOUNTER — Telehealth: Payer: Self-pay | Admitting: *Deleted

## 2023-10-19 DIAGNOSIS — C719 Malignant neoplasm of brain, unspecified: Secondary | ICD-10-CM

## 2023-10-19 NOTE — Telephone Encounter (Signed)
PC to patient, informed her authorization has been obtained from her insurance company for her MRI, however, they request that it be done at [facility].  Phone number given to patient to schedule, [phone].  Patient informed she has appointment with Dr Barbaraann Cao on 10/30/23 at 9:30.  If MRI is not scheduled before this appointment, we will push this out.  Patient verbalizes understanding.

## 2023-10-20 ENCOUNTER — Ambulatory Visit
Admission: RE | Admit: 2023-10-20 | Discharge: 2023-10-20 | Disposition: A | Discharge status: Home / Self Care | Payer: BC Managed Care – PPO | Source: Ambulatory Visit | Attending: Internal Medicine

## 2023-10-20 DIAGNOSIS — Z85841 Personal history of malignant neoplasm of brain: Secondary | ICD-10-CM | POA: Diagnosis not present

## 2023-10-20 DIAGNOSIS — C719 Malignant neoplasm of brain, unspecified: Secondary | ICD-10-CM

## 2023-10-20 DIAGNOSIS — Z9889 Other specified postprocedural states: Secondary | ICD-10-CM | POA: Diagnosis not present

## 2023-10-20 MED ORDER — GADOPICLENOL 0.5 MMOL/ML IV SOLN
9.0000 mL | Freq: Once | INTRAVENOUS | Status: AC | PRN
  Administered 2023-10-20: 9 mL via INTRAVENOUS

## 2023-10-24 ENCOUNTER — Other Ambulatory Visit: Payer: BC Managed Care – PPO

## 2023-10-30 ENCOUNTER — Inpatient Hospital Stay: Payer: BC Managed Care – PPO | Attending: Internal Medicine | Admitting: Internal Medicine

## 2023-10-30 ENCOUNTER — Other Ambulatory Visit: Payer: Self-pay

## 2023-10-30 VITALS — BP 135/94 | HR 88 | Temp 97.4°F | Resp 16 | Wt 198.5 lb

## 2023-10-30 DIAGNOSIS — C719 Malignant neoplasm of brain, unspecified: Secondary | ICD-10-CM | POA: Diagnosis not present

## 2023-10-30 DIAGNOSIS — Z8052 Family history of malignant neoplasm of bladder: Secondary | ICD-10-CM | POA: Insufficient documentation

## 2023-10-30 DIAGNOSIS — R5383 Other fatigue: Secondary | ICD-10-CM | POA: Diagnosis not present

## 2023-10-30 DIAGNOSIS — C712 Malignant neoplasm of temporal lobe: Secondary | ICD-10-CM | POA: Diagnosis not present

## 2023-10-30 DIAGNOSIS — R569 Unspecified convulsions: Secondary | ICD-10-CM

## 2023-10-30 DIAGNOSIS — Z79899 Other long term (current) drug therapy: Secondary | ICD-10-CM | POA: Diagnosis not present

## 2023-10-30 MED ORDER — LEVETIRACETAM 250 MG PO TABS: 250.0000 mg | ORAL_TABLET | Freq: Two times a day (BID) | ORAL | 5 refills | Status: DC

## 2023-10-30 NOTE — Progress Notes (Signed)
 [facility] [address]  [place], [place] [address] [phone]   Interval Evaluation  Date of Service: 10/30/23 Patient Name: Stacy Gutierrez Patient MRN: [id number] Patient DOB: [birth year] Provider: Arthea MARLA Manns, MD  Identifying Statement:  Stacy Gutierrez is a 33 y.o. 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 Saturnino at [facility].  Path demonstrates WHO 2 diffuse astrocytoma, IDH mt   06/12/2021 Progression   Progression of disease #1   07/09/2021 Procedure   Oocyte retrieval at [facility], prior to initiation of chemotherapy   07/21/2021 - 08/31/2021 Radiation Therapy   IMRT and concurrent Temodar  with Dr. Izell   10/14/2021 -  Chemotherapy   Patient is on Treatment Plan : BRAIN Low Grade Glioma Grade II, Glioblastoma,  Astrocytoma, Oligodendroma, Recurrent or Progressive / Temozolomide  D1-5 Q28 Days      Biomarkers: MGMT Unknown.  IDH 1/2 Mutated.  EGFR Unknown  TERT Unmutated   Interval History: Stacy Gutierrez presents today for follow up following recent MRI brain.  No new or progressive symptoms.  No further seizures.  Fatigue still present, but remains active overall.  H+P (06/22/21) Patient presents today as referral from Dr. Zulema at [facility], for local radiation and chemotherapy for progressive glioma.  Patient describes breakthrough seizure episode earlier this month, [place] [facility].  Vimpat  was poorly tolerated, so Lamictal  titration was initiated.  She is currently up to 50mg  BID, with plans to increase to 100mg  BID. Also on Keppra  1500/2000.  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  [facility] team is recommending radiation and Temodar  given recent progression noted on  MRI.  No exposure to RT or chemo since diagnosis in 2019.  Medications: Current Outpatient Medications on File Prior to Visit  Medication Sig Dispense Refill   levETIRAcetam  (KEPPRA ) 500 MG tablet Take 1 tablet (500 mg total) by mouth 2 (two) times daily.     levonorgestrel  (MIRENA , 52 MG,) 20 MCG/DAY IUD 1 each by Intrauterine route once for 1 dose. 1 each 0   LORazepam  (ATIVAN ) 1 MG tablet Take 1 tablet (1 mg total) by mouth daily as needed. 30 tablet 0   No current facility-administered medications on file prior to visit.    Allergies:  Allergies  Allergen Reactions   Lamotrigine  Rash   Past Medical History:  Past Medical History:  Diagnosis Date   Astrocytoma (HCC)    Mononucleosis 01/22/2010   Seizures (HCC)    Vasovagal syncope    Past Surgical History:  Past Surgical History:  Procedure Laterality Date   CRANIOTOMY Left    TIBIA FRACTURE SURGERY  03/24/2010   Tibial Fracture-left leg--plate/screw--Dr. Duwaine   TONSILLECTOMY     TYMPANOSTOMY TUBE PLACEMENT Left 08/26/2021   Social History:  Social History   Socioeconomic History   Marital status: Married    Spouse name: Not on file   Number of children: 0   Years of education: Not on file   Highest education level: Not on file  Occupational History   Occupation: Student  Tobacco Use   Smoking status: Never   Smokeless tobacco: Never  Vaping Use   Vaping status: Never Used  Substance and Sexual Activity  Alcohol use: No   Drug use: Not on file   Sexual activity: Yes    Partners: Male    Birth control/protection: I.U.D.  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   Social Drivers of Health   Financial Resource Strain: Not on file  Food Insecurity: Not on file  Transportation Needs: No Transportation Needs (06/15/2021)   Received from [facility], [facility]    PRAPARE - Transportation    In the past 12 months, has lack of transportation kept you from medical appointments or from getting medications?: No    Lack of Transportation (Non-Medical): No  Physical Activity: Not on file  Stress: Not on file  Social Connections: Unknown (02/21/2022)   Received from [facility], [facility]   Social Network    Social Network: Not on file  Intimate Partner Violence: Unknown (01/27/2022)   Received from [facility], [facility]   HITS    Physically Hurt: Not on file    Insult or Talk Down To: Not on file    Threaten Physical Harm: Not on file    Scream or Curse: Not on file   Family History:  Family History  Problem Relation Age of Onset   Bladder Cancer Mother    Diabetes type II Maternal Grandmother        heart issues   Peripheral vascular disease Maternal Grandmother    Coronary artery disease Other    Diabetes Other    Hyperlipidemia Other    Stroke Other     Review of Systems: Constitutional: Doesn't report fevers, chills or abnormal weight loss Eyes: Doesn't report blurriness of vision Ears, nose, mouth, throat, and face: Doesn't report sore throat Respiratory: Doesn't report cough, dyspnea or wheezes Cardiovascular: Doesn't report palpitation, chest discomfort  Gastrointestinal:  Doesn't report nausea, constipation, diarrhea GU: Doesn't report incontinence Skin: Doesn't report skin rashes Neurological: Per HPI Musculoskeletal: Doesn't report joint pain Behavioral/Psych: Doesn't report anxiety  Physical Exam: There were no vitals filed for this visit. KPS: 90. General: Alert, cooperative, pleasant, in no acute distress Head: Normal EENT: No conjunctival injection or scleral icterus.  Lungs: Resp effort normal Cardiac: Regular rate Abdomen: Non-distended abdomen Skin: No rashes cyanosis or petechiae. Extremities: No clubbing or edema  Neurologic Exam: Mental Status: Awake, alert, attentive to examiner. Oriented to  self and environment. Language is fluent with intact comprehension.  Cranial Nerves: Visual acuity is grossly normal. Visual fields are full. Extra-ocular movements intact. No ptosis. Face is symmetric Motor: Tone and bulk are normal. Power is full in both arms and legs. Reflexes are symmetric, no pathologic reflexes present.  Sensory: Intact to light touch Gait: Normal.   Labs: I have reviewed the data as listed    Component Value Date/Time   NA 139 07/31/2023 1314   K 3.9 07/31/2023 1314   CL 102 07/31/2023 1314   CO2 29 07/31/2023 1314   GLUCOSE 71 07/31/2023 1314   BUN 13 07/31/2023 1314   CREATININE 0.76 07/31/2023 1314   CALCIUM 9.7 07/31/2023 1314   PROT 7.5 07/31/2023 1314   ALBUMIN 4.7 07/31/2023 1314   AST 29 07/31/2023 1314   ALT 50 (H) 07/31/2023 1314   ALKPHOS 82 07/31/2023 1314   BILITOT 0.3 07/31/2023 1314   GFRNONAA >60 07/31/2023 1314   Lab Results  Component Value Date   WBC  7.0 07/31/2023   NEUTROABS 4.3 07/31/2023   HGB 13.5 07/31/2023   HCT 38.5 07/31/2023   MCV 87.7 07/31/2023   PLT 243 07/31/2023    Imaging:  [facility] Clinician Interpretation: I have personally reviewed the CNS images as listed.  My interpretation, in the context of the patient's clinical presentation, is stable disease   MR Brain W Wo Contrast Result Date: 10/20/2023 CLINICAL DATA:  Brain/CNS neoplasm, assess treatment response. Left temporal WHO grade 2 astrocytoma, IDH mutant. EXAM: MRI HEAD WITHOUT AND WITH CONTRAST TECHNIQUE: Multiplanar, multiecho pulse sequences of the brain and surrounding structures were obtained without and with intravenous contrast. CONTRAST:  9 ML [employer]  COMPARISON:  MR HEAD WITHOUT AND WITH CONTRAST 04/05/2023 and 11/04/2022. FINDINGS: Brain: Patient is status post left frontotemporal craniotomy for resection of tumor. Resection cavity involving the left frontal operculum and temporal lobe is stable. Surrounding T2 FLAIR signal hyperintensity is stable. No  significant postcontrast enhancement is present. No expansion of the T2 signal changes present. Volume loss is present with ex vacuo dilation of left lateral ventricle, stable. Right hemisphere white matter is within normal limits. No significant extra-axial fluid collections present. The brainstem and cerebellum are within normal limits. The internal auditory canals are within normal limits. A Midline structures are within normal limits. Vascular: Flow is present in the major intracranial arteries. Skull and upper cervical spine: The craniocervical junction is normal. Upper cervical spine is within normal limits. Marrow signal is unremarkable. Sinuses/Orbits: The paranasal sinuses and mastoid air cells are clear. The globes and orbits are within normal limits. IMPRESSION: 1. Stable postoperative changes of left frontotemporal craniotomy for resection of tumor. 2. Stable surrounding T2 FLAIR signal hyperintensity without expansion or postcontrast enhancement. Given the stability over time, this more likely represents post treatment changes than residual tumor. 3. No new or progressive disease. Electronically Signed   By: Lonni Necessary M.D.   On: 10/20/2023 13:03     Assessment/Plan Astrocytoma (HCC)  Seizures (HCC)  Stacy Gutierrez is clinically stable today.  MRI brain demonstrates stable findings.  We recommended continuing imaging surveillance at this time.  For seizures may try reducing Keppra  to 250mg  BID given long term seizure control, fatigue issues.  May dose sumatriptan, NSAIDs or Tylenol for breakthrough migraines.  We ask that Stacy Gutierrez return to clinic following next [facility] visit or as needed.  All questions were answered. The patient knows to call the clinic with any problems, questions or concerns. No barriers to learning were detected.  The total time spent in the encounter was 30 minutes and more than 50% was on counseling and review of test results   Arthea MARLA Manns,  MD Medical Director of Neuro-Oncology [facility] 10/30/23 9:50 AM

## 2023-10-31 ENCOUNTER — Other Ambulatory Visit: Payer: Self-pay

## 2023-11-06 DIAGNOSIS — Z3169 Encounter for other general counseling and advice on procreation: Secondary | ICD-10-CM | POA: Diagnosis not present

## 2023-11-16 DIAGNOSIS — I6381 Other cerebral infarction due to occlusion or stenosis of small artery: Secondary | ICD-10-CM | POA: Diagnosis not present

## 2023-11-16 DIAGNOSIS — Z113 Encounter for screening for infections with a predominantly sexual mode of transmission: Secondary | ICD-10-CM | POA: Diagnosis not present

## 2023-11-16 DIAGNOSIS — I158 Other secondary hypertension: Secondary | ICD-10-CM | POA: Diagnosis not present

## 2023-11-16 DIAGNOSIS — G936 Cerebral edema: Secondary | ICD-10-CM | POA: Diagnosis not present

## 2023-11-16 DIAGNOSIS — R569 Unspecified convulsions: Secondary | ICD-10-CM | POA: Diagnosis not present

## 2023-11-16 DIAGNOSIS — Z3169 Encounter for other general counseling and advice on procreation: Secondary | ICD-10-CM | POA: Diagnosis not present

## 2023-11-21 ENCOUNTER — Other Ambulatory Visit: Payer: Self-pay | Admitting: *Deleted

## 2023-11-21 DIAGNOSIS — R569 Unspecified convulsions: Secondary | ICD-10-CM

## 2023-11-21 MED ORDER — LEVETIRACETAM 250 MG PO TABS: 250.0000 mg | ORAL_TABLET | Freq: Two times a day (BID) | ORAL | 5 refills | Status: DC

## 2023-11-23 ENCOUNTER — Other Ambulatory Visit: Payer: Self-pay

## 2023-11-27 DIAGNOSIS — Z3169 Encounter for other general counseling and advice on procreation: Secondary | ICD-10-CM | POA: Diagnosis not present

## 2024-01-16 DIAGNOSIS — C719 Malignant neoplasm of brain, unspecified: Secondary | ICD-10-CM | POA: Diagnosis not present

## 2024-01-17 ENCOUNTER — Telehealth: Payer: Self-pay | Admitting: *Deleted

## 2024-01-17 ENCOUNTER — Encounter: Payer: Self-pay | Admitting: Internal Medicine

## 2024-01-17 DIAGNOSIS — C719 Malignant neoplasm of brain, unspecified: Secondary | ICD-10-CM

## 2024-01-17 NOTE — Telephone Encounter (Signed)
 Stacy Gutierrez states she was due for an MRI at [facility]. They cannot get her in until June at the earliest. She is requesting to have the MRI done here at [facility] in the next 1-2 weeks. Please advise.

## 2024-01-17 NOTE — Telephone Encounter (Signed)
 Notified that Dr Barbaraann Cao has ordered MRI.

## 2024-01-19 ENCOUNTER — Other Ambulatory Visit: Payer: Self-pay

## 2024-01-23 ENCOUNTER — Other Ambulatory Visit: Payer: Self-pay

## 2024-01-24 ENCOUNTER — Ambulatory Visit (HOSPITAL_COMMUNITY)
Admission: RE | Admit: 2024-01-24 | Discharge: 2024-01-24 | Disposition: A | Discharge status: Home / Self Care | Source: Ambulatory Visit | Attending: Internal Medicine | Admitting: Internal Medicine

## 2024-01-24 DIAGNOSIS — C719 Malignant neoplasm of brain, unspecified: Secondary | ICD-10-CM | POA: Diagnosis not present

## 2024-01-24 MED ORDER — GADOBUTROL 1 MMOL/ML IV SOLN
9.0000 mL | Freq: Once | INTRAVENOUS | Status: AC | PRN
  Administered 2024-01-24: 9 mL via INTRAVENOUS

## 2024-01-25 ENCOUNTER — Ambulatory Visit: Admitting: Internal Medicine

## 2024-01-25 ENCOUNTER — Telehealth: Payer: Self-pay | Admitting: *Deleted

## 2024-01-25 NOTE — Telephone Encounter (Signed)
 Requested that MRI brain images from 01/24/24 be pushed to [facility] through [facility].

## 2024-01-30 ENCOUNTER — Ambulatory Visit: Admitting: Internal Medicine

## 2024-01-30 ENCOUNTER — Inpatient Hospital Stay: Attending: Internal Medicine | Admitting: Internal Medicine

## 2024-01-30 VITALS — BP 137/85 | HR 76 | Temp 97.5°F | Resp 17 | Wt 186.1 lb

## 2024-01-30 DIAGNOSIS — C719 Malignant neoplasm of brain, unspecified: Secondary | ICD-10-CM

## 2024-01-30 DIAGNOSIS — C712 Malignant neoplasm of temporal lobe: Secondary | ICD-10-CM | POA: Diagnosis not present

## 2024-01-30 DIAGNOSIS — R569 Unspecified convulsions: Secondary | ICD-10-CM | POA: Diagnosis not present

## 2024-01-30 NOTE — Progress Notes (Signed)
 [facility] [address]  [place], [place] [address] [phone]   Interval Evaluation  Date of Service: 01/30/24 Patient Name: Stacy Gutierrez Patient MRN: [id number] Patient DOB: May 21, 1991 Provider: Henreitta Leber, MD  Identifying Statement:  Stacy Gutierrez is a 33 y.o. 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 Zachery Conch at [facility].  Path demonstrates WHO 2 diffuse astrocytoma, IDH mt   06/12/2021 Progression   Progression of disease #1   07/09/2021 Procedure   Oocyte retrieval at [facility], prior to initiation of chemotherapy   07/21/2021 - 08/31/2021 Radiation Therapy   IMRT and concurrent Temodar with Dr. Basilio Cairo   10/14/2021 - 11/08/2022 Chemotherapy   Completes 5 cycles of 5-day Temozolomide    Biomarkers: MGMT Unknown.  IDH 1/2 Mutated.  EGFR Unknown  TERT Unmutated   Interval History: Stacy Gutierrez presents today for follow up following recent MRI brain.  Continues to deny any focal neurologic symptoms.  No further seizures.  Fatigue still present, but remains active overall.  H+P (06/22/21) Patient presents today as referral from Dr. Noe Gens at [facility], for local radiation and chemotherapy for progressive glioma.  Patient describes breakthrough seizure episode earlier this month, leading to [place] and [facility].  Vimpat was poorly tolerated, so Lamictal titration was initiated.  She is currently up to 50mg  BID, with plans to increase to 100mg  BID. Also on Keppra 1500/2000.  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  [facility] team is recommending radiation and Temodar given recent progression noted on MRI.  No exposure to RT or chemo since diagnosis in 2019.  Medications: Current Outpatient  Medications on File Prior to Visit  Medication Sig Dispense Refill   levETIRAcetam (KEPPRA) 250 MG tablet Take 1 tablet (250 mg total) by mouth 2 (two) times daily. 60 tablet 5   levonorgestrel (MIRENA, 52 MG,) 20 MCG/DAY IUD 1 each by Intrauterine route once for 1 dose. 1 each 0   LORazepam (ATIVAN) 1 MG tablet Take 1 tablet (1 mg total) by mouth daily as needed. 30 tablet 0   No current facility-administered medications on file prior to visit.    Allergies:  Allergies  Allergen Reactions   Lamotrigine Rash   Past Medical History:  Past Medical History:  Diagnosis Date   Astrocytoma (HCC)    Mononucleosis 01/22/2010   Seizures (HCC)    Vasovagal syncope    Past Surgical History:  Past Surgical History:  Procedure Laterality Date   CRANIOTOMY Left    TIBIA FRACTURE SURGERY  03/24/2010   Tibial Fracture-left leg--plate/screw--Dr. Samuel Bouche   TONSILLECTOMY     TYMPANOSTOMY TUBE PLACEMENT Left 08/26/2021   Social History:  Social History   Socioeconomic History   Marital status: Married    Spouse name: Not on file   Number of children: 0   Years of education: Not on file   Highest education level: Not on file  Occupational History   Occupation: Consulting civil engineer  Tobacco Use   Smoking status: Never   Smokeless tobacco: Never  Vaping Use   Vaping status: Never Used  Substance and Sexual Activity   Alcohol use: No   Drug use: Not on file   Sexual activity: Yes  Partners: Male    Birth control/protection: I.U.D.  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   Social Drivers of Health   Financial Resource Strain: Not on file  Food Insecurity: Not on file  Transportation Needs: No Transportation Needs (06/15/2021)   Received from [facility], [facility]   PRAPARE - Transportation    In the past 12 months, has lack of transportation  kept you from medical appointments or from getting medications?: No    Lack of Transportation (Non-Medical): No  Physical Activity: Not on file  Stress: Not on file  Social Connections: Unknown (02/21/2022)   Received from [facility], [facility]   Social Network    Social Network: Not on file  Intimate Partner Violence: Unknown (01/27/2022)   Received from [facility], [facility]   HITS    Physically Hurt: Not on file    Insult or Talk Down To: Not on file    Threaten Physical Harm: Not on file    Scream or Curse: Not on file   Family History:  Family History  Problem Relation Age of Onset   Bladder Cancer Mother    Diabetes type II Maternal Grandmother        "heart issues"   Peripheral vascular disease Maternal Grandmother    Coronary artery disease Other    Diabetes Other    Hyperlipidemia Other    Stroke Other     Review of Systems: Constitutional: Doesn't report fevers, chills or abnormal weight loss Eyes: Doesn't report blurriness of vision Ears, nose, mouth, throat, and face: Doesn't report sore throat Respiratory: Doesn't report cough, dyspnea or wheezes Cardiovascular: Doesn't report palpitation, chest discomfort  Gastrointestinal:  Doesn't report nausea, constipation, diarrhea GU: Doesn't report incontinence Skin: Doesn't report skin rashes Neurological: Per HPI Musculoskeletal: Doesn't report joint pain Behavioral/Psych: Doesn't report anxiety  Physical Exam: Vitals:   01/30/24 1038  BP: 137/85  Pulse: 76  Resp: 17  Temp: (!) 97.5 F (36.4 C)  SpO2: 97%   KPS: 90. General: Alert, cooperative, pleasant, in no acute distress Head: Normal EENT: No conjunctival injection or scleral icterus.  Lungs: Resp effort normal Cardiac: Regular rate Abdomen: Non-distended abdomen Skin: No rashes cyanosis or petechiae. Extremities: No clubbing or edema  Neurologic Exam: Mental Status: Awake, alert, attentive to examiner. Oriented to self and  environment. Language is fluent with intact comprehension.  Cranial Nerves: Visual acuity is grossly normal. Visual fields are full. Extra-ocular movements intact. No ptosis. Face is symmetric Motor: Tone and bulk are normal. Power is full in both arms and legs. Reflexes are symmetric, no pathologic reflexes present.  Sensory: Intact to light touch Gait: Normal.   Labs: I have reviewed the data as listed    Component Value Date/Time   NA 139 07/31/2023 1314   K 3.9 07/31/2023 1314   CL 102 07/31/2023 1314   CO2 29 07/31/2023 1314   GLUCOSE 71 07/31/2023 1314   BUN 13 07/31/2023 1314   CREATININE 0.76 07/31/2023 1314   CALCIUM 9.7 07/31/2023 1314   PROT 7.5 07/31/2023 1314   ALBUMIN 4.7 07/31/2023 1314   AST 29 07/31/2023 1314   ALT 50 (H) 07/31/2023 1314   ALKPHOS 82 07/31/2023 1314   BILITOT 0.3 07/31/2023 1314   GFRNONAA >60 07/31/2023 1314   Lab Results  Component Value Date   WBC  7.0 07/31/2023   NEUTROABS 4.3 07/31/2023   HGB 13.5 07/31/2023   HCT 38.5 07/31/2023   MCV 87.7 07/31/2023   PLT 243 07/31/2023    Imaging:  [facility] Clinician Interpretation: I have personally reviewed the CNS images as listed.  My interpretation, in the context of the patient's clinical presentation, is stable disease   MR BRAIN W WO CONTRAST Result Date: 01/24/2024 .  CLINICAL DATA: Brain/CNS neoplasm.  Astrocytoma.  Assess treatment response EXAM: MRI HEAD WITHOUT AND WITH CONTRAST TECHNIQUE: Multiplanar, multiecho pulse sequences of the brain and surrounding structures were obtained without and with intravenous contrast. CONTRAST:  9mL GADAVIST GADOBUTROL 1 MMOL/ML IV SOLN COMPARISON:  MR head without and with contrast 10/20/2023 and 04/05/2023. FINDINGS: Brain: Left frontotemporal craniotomy for tumor resection is again noted. No residual enhancing mass lesion is present. Surrounding T2 and FLAIR signal hyperintensities are stable compared to the prior exams. No new enhancement or T2 signal  changes present. No other significant white matter disease is present. Deep brain nuclei are within normal limits. The brainstem and cerebellum are within normal limits. The internal auditory canals are within normal limits. Midline structures are within normal limits. Vascular: Flow is present in the major intracranial arteries. Skull and upper cervical spine: The craniocervical junction is normal. Upper cervical spine is within normal limits. Marrow signal is unremarkable. Sinuses/Orbits: The paranasal sinuses and mastoid air cells are clear. The globes and orbits are within normal limits. IMPRESSION: 1. Stable left frontotemporal craniotomy for tumor resection. 2. No residual enhancing mass lesion. 3. Stable surrounding T2 and FLAIR signal hyperintensities. 4. No new enhancement or T2 signal changes. Electronically Signed   By: Marin Roberts M.D.   On: 01/24/2024 17:28     Assessment/Plan Astrocytoma (HCC)  Seizures (HCC)  REIGHLYN ELMES is clinically stable today.  MRI brain again demonstrates stable findings, now 15 months s/p cessation of systemic therapy.  We recommended continuing imaging surveillance at this time.  Should continue Keppra 250mg  BID.  May dose sumatriptan, NSAIDs or Tylenol for breakthrough migraines.  We ask that CAMARI QUINTANILLA return to clinic in 4 months following next brain MRI, or sooner as needed.  All questions were answered. The patient knows to call the clinic with any problems, questions or concerns. No barriers to learning were detected.  The total time spent in the encounter was 30 minutes and more than 50% was on counseling and review of test results   Henreitta Leber, MD Medical Director of Neuro-Oncology [facility] [place] 01/30/24 10:33 AM

## 2024-01-31 ENCOUNTER — Other Ambulatory Visit: Payer: Self-pay

## 2024-02-12 ENCOUNTER — Other Ambulatory Visit: Payer: Self-pay | Admitting: *Deleted

## 2024-02-14 ENCOUNTER — Telehealth: Payer: Self-pay

## 2024-02-14 NOTE — Telephone Encounter (Signed)
 VM received from pt to report worsening back pain. She states she has visited chiropractors without relief. She says the pain is a like a "pinching sensation" which only happens at night, awakening her from sleep. She would like to know if Dr. Mark Sil has an explanation for her worsening back pain--if it could be related to her diagnosis. Will consult Dr. Mark Sil.

## 2024-02-14 NOTE — Telephone Encounter (Signed)
 TC to pt to advise her to reach out to her PCP or another provider regarding her back pain. Informed that Dr. Mark Sil said the pain is unrelated to her brain tumor. She states she was wondering if it is due to chemotherapy. Informed that Dr. Mark Sil would have addressed the issue if he suspected the back pain was related to her tumor or the treatment she is receiving for this. Suggested that she try taking Tylenol or ibuprofen before bed unless she has been advised by a provider not to take these medications. Reiterated that she should f/u w/ her PCP.

## 2024-06-04 ENCOUNTER — Ambulatory Visit (HOSPITAL_COMMUNITY)
Admission: RE | Admit: 2024-06-04 | Discharge: 2024-06-04 | Disposition: A | Discharge status: Home / Self Care | Source: Ambulatory Visit | Attending: Internal Medicine | Admitting: Internal Medicine

## 2024-06-04 DIAGNOSIS — C719 Malignant neoplasm of brain, unspecified: Secondary | ICD-10-CM | POA: Diagnosis not present

## 2024-06-04 MED ORDER — GADOBUTROL 1 MMOL/ML IV SOLN
8.0000 mL | Freq: Once | INTRAVENOUS | Status: AC | PRN
  Administered 2024-06-04 (×2): 8 mL via INTRAVENOUS

## 2024-06-18 ENCOUNTER — Telehealth: Payer: Self-pay | Admitting: Internal Medicine

## 2024-06-18 ENCOUNTER — Inpatient Hospital Stay: Attending: Internal Medicine | Admitting: Internal Medicine

## 2024-06-18 VITALS — BP 105/78 | HR 73 | Temp 97.6°F | Resp 17 | Ht 65.75 in | Wt 180.0 lb

## 2024-06-18 DIAGNOSIS — R5383 Other fatigue: Secondary | ICD-10-CM | POA: Insufficient documentation

## 2024-06-18 DIAGNOSIS — C719 Malignant neoplasm of brain, unspecified: Secondary | ICD-10-CM

## 2024-06-18 DIAGNOSIS — C712 Malignant neoplasm of temporal lobe: Secondary | ICD-10-CM | POA: Diagnosis present

## 2024-06-18 NOTE — Telephone Encounter (Signed)
 Scheduled appointment per 8/26 los. Called and left a VM with appointment details for the patient.

## 2024-06-18 NOTE — Progress Notes (Signed)
 [facility] [address]  [place], [place] [address] [phone]   Interval Evaluation  Date of Service: 06/18/24 Patient Name: Stacy Gutierrez Patient MRN: [id number] Patient DOB: June 05, 1991 Provider: Arthea MARLA Manns, MD  Identifying Statement:  Stacy Gutierrez is a 33 y.o. 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 Saturnino at [facility].  Path demonstrates WHO 2 diffuse astrocytoma, IDH mt   06/12/2021 Progression   Progression of disease #1   07/09/2021 Procedure   Oocyte retrieval at [facility], prior to initiation of chemotherapy   07/21/2021 - 08/31/2021 Radiation Therapy   IMRT and concurrent Temodar  with Dr. Izell   10/14/2021 - 11/08/2022 Chemotherapy   Completes 5 cycles of 5-day Temozolomide     Biomarkers: MGMT Unknown.  IDH 1/2 Mutated.  EGFR Unknown  TERT Unmutated   Interval History: Stacy Gutierrez presents today for follow up following recent MRI brain.  Continues to deny any focal neurologic symptoms.  No further seizures.  Fatigue still present, but remains active overall.  Has upcoming trip to [place] planned next month.  H+P (06/22/21) Patient presents today as referral from Dr. Zulema at [facility], for local radiation and chemotherapy for progressive glioma.  Patient describes breakthrough seizure episode earlier this month, leading to [place] [facility].  Vimpat  was poorly tolerated, so Lamictal  titration was initiated.  She is currently up to 50mg  BID, with plans to increase to 100mg  BID. Also on Keppra  1500/2000.  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  [facility] team is recommending radiation and Temodar  given recent progression noted on MRI.  No exposure to RT or chemo since diagnosis  in 2019.  Medications: Current Outpatient Medications on File Prior to Visit  Medication Sig Dispense Refill   levETIRAcetam  (KEPPRA ) 250 MG tablet Take 1 tablet (250 mg total) by mouth 2 (two) times daily. 60 tablet 5   levonorgestrel  (MIRENA , 52 MG,) 20 MCG/DAY IUD 1 each by Intrauterine route once for 1 dose. 1 each 0   LORazepam  (ATIVAN ) 1 MG tablet Take 1 tablet (1 mg total) by mouth daily as needed. 30 tablet 0   No current facility-administered medications on file prior to visit.    Allergies:  Allergies  Allergen Reactions   Lamotrigine  Rash   Past Medical History:  Past Medical History:  Diagnosis Date   Astrocytoma (HCC)    Mononucleosis 01/22/2010   Seizures (HCC)    Vasovagal syncope    Past Surgical History:  Past Surgical History:  Procedure Laterality Date   CRANIOTOMY Left    TIBIA FRACTURE SURGERY  03/24/2010   Tibial Fracture-left leg--plate/screw--Dr. Duwaine   TONSILLECTOMY     TYMPANOSTOMY TUBE PLACEMENT Left 08/26/2021   Social History:  Social History   Socioeconomic History   Marital status: Married    Spouse name: Not on file   Number of children: 0   Years of education: Not on file   Highest education level: Not on file  Occupational History   Occupation: Consulting civil engineer  Tobacco Use   Smoking status: Never   Smokeless tobacco: Never  Vaping Use   Vaping status: Never Used  Substance and Sexual Activity   Alcohol use: No   Drug use: Not on file  Sexual activity: Yes    Partners: Male    Birth control/protection: I.U.D.  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   Social Drivers of Health   Financial Resource Strain: Not on file  Food Insecurity: Not on file  Transportation Needs: No Transportation Needs (06/15/2021)   Received from [facility]   PRAPARE - Transportation    In the past 12 months, has lack of  transportation kept you from medical appointments or from getting medications?: No    Lack of Transportation (Non-Medical): No  Physical Activity: Not on file  Stress: Not on file  Social Connections: Unknown (02/21/2022)   Received from [facility]   Social Network    Social Network: Not on file  Intimate Partner Violence: Unknown (01/27/2022)   Received from [facility]   HITS    Physically Hurt: Not on file    Insult or Talk Down To: Not on file    Threaten Physical Harm: Not on file    Scream or Curse: Not on file   Family History:  Family History  Problem Relation Age of Onset   Bladder Cancer Mother    Diabetes type II Maternal Grandmother        heart issues   Peripheral vascular disease Maternal Grandmother    Coronary artery disease Other    Diabetes Other    Hyperlipidemia Other    Stroke Other     Review of Systems: Constitutional: Doesn't report fevers, chills or abnormal weight loss Eyes: Doesn't report blurriness of vision Ears, nose, mouth, throat, and face: Doesn't report sore throat Respiratory: Doesn't report cough, dyspnea or wheezes Cardiovascular: Doesn't report palpitation, chest discomfort  Gastrointestinal:  Doesn't report nausea, constipation, diarrhea GU: Doesn't report incontinence Skin: Doesn't report skin rashes Neurological: Per HPI Musculoskeletal: Doesn't report joint pain Behavioral/Psych: Doesn't report anxiety  Physical Exam: Vitals:   06/18/24 1040  BP: 105/78  Pulse: 73  Resp: 17  Temp: 97.6 F (36.4 C)  SpO2: 98%   KPS: 90. General: Alert, cooperative, pleasant, in no acute distress Head: Normal EENT: No conjunctival injection or scleral icterus.  Lungs: Resp effort normal Cardiac: Regular rate Abdomen: Non-distended abdomen Skin: No rashes cyanosis or petechiae. Extremities: No clubbing or edema  Neurologic Exam: Mental Status: Awake, alert, attentive to examiner. Oriented to self and environment. Language is  fluent with intact comprehension.  Cranial Nerves: Visual acuity is grossly normal. Visual fields are full. Extra-ocular movements intact. No ptosis. Face is symmetric Motor: Tone and bulk are normal. Power is full in both arms and legs. Reflexes are symmetric, no pathologic reflexes present.  Sensory: Intact to light touch Gait: Normal.   Labs: I have reviewed the data as listed    Component Value Date/Time   NA 139 07/31/2023 1314   K 3.9 07/31/2023 1314   CL 102 07/31/2023 1314   CO2 29 07/31/2023 1314   GLUCOSE 71 07/31/2023 1314   BUN 13 07/31/2023 1314   CREATININE 0.76 07/31/2023 1314   CALCIUM 9.7 07/31/2023 1314   PROT 7.5 07/31/2023 1314   ALBUMIN 4.7 07/31/2023 1314   AST 29 07/31/2023 1314   ALT 50 (H) 07/31/2023 1314   ALKPHOS 82 07/31/2023 1314   BILITOT 0.3 07/31/2023 1314   GFRNONAA >60 07/31/2023 1314   Lab Results  Component Value Date   WBC 7.0 07/31/2023  NEUTROABS 4.3 07/31/2023   HGB 13.5 07/31/2023   HCT 38.5 07/31/2023   MCV 87.7 07/31/2023   PLT 243 07/31/2023    Imaging:  [facility] Clinician Interpretation: I have personally reviewed the CNS images as listed.  My interpretation, in the context of the patient's clinical presentation, is stable disease   MR BRAIN W WO CONTRAST Result Date: 06/04/2024 CLINICAL DATA:  Astrocytoma, evaluate treatment response EXAM: MRI HEAD WITHOUT AND WITH CONTRAST TECHNIQUE: Multiplanar, multiecho pulse sequences of the brain and surrounding structures were obtained without and with intravenous contrast. CONTRAST:  8mL GADAVIST  GADOBUTROL  1 MMOL/ML IV SOLN COMPARISON:  January 24, 2024 FINDINGS: MRI brain: There has been a left frontotemporal craniotomy. There is a resection cavity with cyst in the sylvian fissure. There is surrounding T2 hyperintensity in the anterior temporal lobe, posterior insula and frontal lobe. There is no enhancement. No change from the prior study. Signal in the brain parenchyma elsewhere is normal.  There is no acute or chronic infarct. The ventricles are normal. There are normal flow signals in the carotid arteries and basilar artery. No significant bone marrow signal abnormality. No significant abnormality in the paranasal sinuses or soft tissues. IMPRESSION: Postoperative changes from left frontotemporal tumor resection. There is T2 hyperintensity within the left temporal lobe and frontal lobe unchanged from the prior study. No enhancement. Electronically Signed   By: Nancyann Burns M.D.   On: 06/04/2024 15:01     Assessment/Plan Astrocytoma (HCC)  Stacy Gutierrez is clinically stable today.  No new or progressive changes.  MRI brain again demonstrates stable findings, now 19 months s/p cessation of systemic therapy.  We recommended continuing imaging surveillance at this time.  Should continue Keppra  250mg  BID.  May dose sumatriptan, NSAIDs or Tylenol for breakthrough migraines.  We ask that Stacy Gutierrez return to clinic in 4 months following next brain MRI, or sooner as needed.  All questions were answered. The patient knows to call the clinic with any problems, questions or concerns. No barriers to learning were detected.  The total time spent in the encounter was 40 minutes and more than 50% was on counseling and review of test results   Arthea MARLA Manns, MD Medical Director of Neuro-Oncology [facility] 06/18/24 10:51 AM

## 2024-06-19 ENCOUNTER — Other Ambulatory Visit: Payer: Self-pay

## 2024-07-15 ENCOUNTER — Encounter: Payer: Self-pay | Admitting: Internal Medicine

## 2024-07-15 ENCOUNTER — Other Ambulatory Visit: Payer: Self-pay | Admitting: Internal Medicine

## 2024-07-15 DIAGNOSIS — R569 Unspecified convulsions: Secondary | ICD-10-CM

## 2024-07-15 MED ORDER — LEVETIRACETAM 250 MG PO TABS: 250.0000 mg | ORAL_TABLET | Freq: Two times a day (BID) | ORAL | 1 refills | Status: AC

## 2024-07-17 ENCOUNTER — Encounter: Payer: Self-pay | Admitting: Internal Medicine

## 2024-09-10 ENCOUNTER — Other Ambulatory Visit: Payer: Self-pay

## 2024-10-11 ENCOUNTER — Encounter: Payer: Self-pay | Admitting: Internal Medicine

## 2024-10-15 ENCOUNTER — Ambulatory Visit (HOSPITAL_COMMUNITY)
Admission: RE | Admit: 2024-10-15 | Discharge: 2024-10-15 | Disposition: A | Discharge status: Home / Self Care | Source: Ambulatory Visit | Attending: Internal Medicine | Admitting: Internal Medicine

## 2024-10-15 ENCOUNTER — Encounter: Payer: Self-pay | Admitting: Internal Medicine

## 2024-10-15 DIAGNOSIS — C719 Malignant neoplasm of brain, unspecified: Secondary | ICD-10-CM | POA: Diagnosis present

## 2024-10-15 MED ORDER — GADOBUTROL 1 MMOL/ML IV SOLN
8.0000 mL | Freq: Once | INTRAVENOUS | Status: AC | PRN
  Administered 2024-10-15: 8 mL via INTRAVENOUS

## 2024-10-21 ENCOUNTER — Other Ambulatory Visit: Payer: Self-pay

## 2024-10-21 ENCOUNTER — Inpatient Hospital Stay: Attending: Internal Medicine | Admitting: Internal Medicine

## 2024-10-21 VITALS — BP 129/89 | HR 75 | Temp 97.2°F | Resp 20 | Wt 189.6 lb

## 2024-10-21 DIAGNOSIS — Z79899 Other long term (current) drug therapy: Secondary | ICD-10-CM | POA: Insufficient documentation

## 2024-10-21 DIAGNOSIS — C719 Malignant neoplasm of brain, unspecified: Secondary | ICD-10-CM | POA: Insufficient documentation

## 2024-10-21 DIAGNOSIS — Z793 Long term (current) use of hormonal contraceptives: Secondary | ICD-10-CM | POA: Insufficient documentation

## 2024-10-21 NOTE — Progress Notes (Signed)
 "  [facility] at [facility] [address]  [place], [place] [address] [phone]   Interval Evaluation  Date of Service: 10/21/2024 Patient Name: Stacy Gutierrez Patient MRN: [id number] Patient DOB: July 15, 1991 Provider: Arthea MARLA Manns, MD  Identifying Statement:  Stacy Gutierrez is a 33 y.o. 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 Saturnino at [facility].  Path demonstrates WHO 2 diffuse astrocytoma, IDH mt   06/12/2021 Progression   Progression of disease #1   07/09/2021 Procedure   Oocyte retrieval at [facility], prior to initiation of chemotherapy   07/21/2021 - 08/31/2021 Radiation Therapy   IMRT and concurrent Temodar  with Dr. Izell   10/14/2021 - 11/08/2022 Chemotherapy   Completes 5 cycles of 5-day Temozolomide     Biomarkers: MGMT Unknown.  IDH 1/2 Mutated.  EGFR Unknown  TERT Unmutated   Interval History: Stacy Gutierrez presents today for follow up following recent MRI brain.  No new or progressive changes reported.  No further seizures.  Fatigue still present, but remains active overall.  Has upcoming addition to her family via surrogate, due date in March.  H+P (06/22/21) Patient presents today as referral from Dr. Zulema at [facility], for local radiation and chemotherapy for progressive glioma.  Patient describes breakthrough seizure episode earlier this month, leading to hospitalization at [place] and [facility].  Vimpat was poorly tolerated, so Lamictal  titration was initiated.  She is currently up to 50mg  BID, with plans to increase to 100mg  BID. Also on Keppra  1500/2000.  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  [facility] team is recommending radiation and Temodar  given recent progression noted on MRI.  No exposure to RT or chemo  since diagnosis in 2019.  Medications: Current Outpatient Medications on File Prior to Visit  Medication Sig Dispense Refill   levETIRAcetam  (KEPPRA ) 250 MG tablet Take 1 tablet (250 mg total) by mouth 2 (two) times daily. 180 tablet 1   levonorgestrel  (MIRENA , 52 MG,) 20 MCG/DAY IUD 1 each by Intrauterine route once for 1 dose. 1 each 0   LORazepam  (ATIVAN ) 1 MG tablet Take 1 tablet (1 mg total) by mouth daily as needed. 30 tablet 0   No current facility-administered medications on file prior to visit.    Allergies:  Allergies  Allergen Reactions   Lamotrigine  Rash   Past Medical History:  Past Medical History:  Diagnosis Date   Astrocytoma (HCC)    Mononucleosis 01/22/2010   Seizures (HCC)    Vasovagal syncope    Past Surgical History:  Past Surgical History:  Procedure Laterality Date   CRANIOTOMY Left    TIBIA FRACTURE SURGERY  03/24/2010   Tibial Fracture-left leg--plate/screw--Dr. Duwaine   TONSILLECTOMY     TYMPANOSTOMY TUBE PLACEMENT Left 08/26/2021   Social History:  Social History   Socioeconomic History   Marital status: Married    Spouse name: Not on file   Number of children: 0   Years of education: Not on file   Highest education level: Not on file  Occupational History   Occupation: Consulting Civil Engineer  Tobacco Use   Smoking status: Never   Smokeless tobacco: Never  Vaping Use   Vaping status: Never Used  Substance and Sexual Activity   Alcohol use: No   Drug  use: Not on file   Sexual activity: Yes    Partners: Male    Birth control/protection: I.U.D.  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   Social Drivers of Health   Tobacco Use: Low Risk (04/04/2023)   Patient History    Smoking Tobacco Use: Never    Smokeless Tobacco Use: Never    Passive Exposure: Not on file  Financial Resource Strain: Not on file  Food Insecurity: No Food Insecurity  (10/21/2024)   Epic    Worried About Programme Researcher, Broadcasting/film/video in the Last Year: Never true    Ran Out of Food in the Last Year: Never true  Transportation Needs: No Transportation Needs (10/21/2024)   Epic    Lack of Transportation (Medical): No    Lack of Transportation (Non-Medical): No  Physical Activity: Not on file  Stress: Not on file  Social Connections: Unknown (02/21/2022)   Received from [facility]   Social Network    Social Network: Not on file  Intimate Partner Violence: Not At Risk (10/21/2024)   Epic    Fear of Current or Ex-Partner: No    Emotionally Abused: No    Physically Abused: No    Sexually Abused: No  Depression (PHQ2-9): Low Risk (10/21/2024)   Depression (PHQ2-9)    PHQ-2 Score: 0  Alcohol Screen: Not on file  Housing: Low Risk (10/21/2024)   Epic    Unable to Pay for Housing in the Last Year: No    Number of Times Moved in the Last Year: 0    Homeless in the Last Year: No  Utilities: Not At Risk (10/21/2024)   Epic    Threatened with loss of utilities: No  Health Literacy: Not on file   Family History:  Family History  Problem Relation Age of Onset   Bladder Cancer Mother    Diabetes type II Maternal Grandmother        heart issues   Peripheral vascular disease Maternal Grandmother    Coronary artery disease Other    Diabetes Other    Hyperlipidemia Other    Stroke Other     Review of Systems: Constitutional: Doesn't report fevers, chills or abnormal weight loss Eyes: Doesn't report blurriness of vision Ears, nose, mouth, throat, and face: Doesn't report sore throat Respiratory: Doesn't report cough, dyspnea or wheezes Cardiovascular: Doesn't report palpitation, chest discomfort  Gastrointestinal:  Doesn't report nausea, constipation, diarrhea GU: Doesn't report incontinence Skin: Doesn't report skin rashes Neurological: Per HPI Musculoskeletal: Doesn't report joint pain Behavioral/Psych: Doesn't report anxiety  Physical  Exam: Vitals:   10/21/24 1019  BP: 129/89  Pulse: 75  Resp: 20  Temp: (!) 97.2 F (36.2 C)  SpO2: 97%   KPS: 90. General: Alert, cooperative, pleasant, in no acute distress Head: Normal EENT: No conjunctival injection or scleral icterus.  Lungs: Resp effort normal Cardiac: Regular rate Abdomen: Non-distended abdomen Skin: No rashes cyanosis or petechiae. Extremities: No clubbing or edema  Neurologic Exam: Mental Status: Awake, alert, attentive to examiner. Oriented to self and environment. Language is fluent with intact comprehension.  Cranial Nerves: Visual acuity is grossly normal. Visual fields are full. Extra-ocular movements intact. No ptosis. Face is symmetric Motor: Tone and bulk are normal. Power is full in both arms and legs. Reflexes are symmetric, no pathologic reflexes present.  Sensory: Intact to light touch Gait: Normal.  Labs: I have reviewed the data as listed    Component Value Date/Time   NA 139 07/31/2023 1314   K 3.9 07/31/2023 1314   CL 102 07/31/2023 1314   CO2 29 07/31/2023 1314   GLUCOSE 71 07/31/2023 1314   BUN 13 07/31/2023 1314   CREATININE 0.76 07/31/2023 1314   CALCIUM 9.7 07/31/2023 1314   PROT 7.5 07/31/2023 1314   ALBUMIN 4.7 07/31/2023 1314   AST 29 07/31/2023 1314   ALT 50 (H) 07/31/2023 1314   ALKPHOS 82 07/31/2023 1314   BILITOT 0.3 07/31/2023 1314   GFRNONAA >60 07/31/2023 1314   Lab Results  Component Value Date   WBC 7.0 07/31/2023   NEUTROABS 4.3 07/31/2023   HGB 13.5 07/31/2023   HCT 38.5 07/31/2023   MCV 87.7 07/31/2023   PLT 243 07/31/2023    Imaging:  [facility] Clinician Interpretation: I have personally reviewed the CNS images as listed.  My interpretation, in the context of the patient's clinical presentation, is stable disease   MR BRAIN W WO CONTRAST Result Date: 10/15/2024 EXAM: MRI BRAIN WITH CONTRAST 10/15/2024 10:53:16 AM TECHNIQUE: Multiplanar multisequence MRI of the head/brain was performed with the  administration of intravenous contrast. COMPARISON: MRI Head Jun 04, 2024. CLINICAL HISTORY: Brain/CNS neoplasm, assess treatment response FINDINGS: BRAIN AND VENTRICLES: Unchanged postoperative changes of left frontotemporal tumor resection, including resection cavity and surrounding T2 hyperintensity in the left temporal and frontal lobes, no enhancement No acute infarct. No acute intracranial hemorrhage. No mass effect or midline shift. No hydrocephalus. The sella is unremarkable. Normal flow voids. ORBITS: No acute abnormality. SINUSES: Moderate left maxillary sinus mucosal thickening. Mild ethmoid air cell mucosal thickening. BONES AND SOFT TISSUES: Normal bone marrow signal and enhancement. No acute soft tissue abnormality. IMPRESSION: 1. Unchanged postoperative changes of left frontotemporal tumor resection, including surrounding T2 hyperintensity. No enhancement. 2. No acute abnormality. Electronically signed by: Gilmore Molt 10/15/2024 11:37 AM EST RP Workstation: [id number]     Assessment/Plan No diagnosis found.  Stacy Gutierrez is clinically stable today.  No new or progressive changes.  MRI brain again demonstrates stable findings,.  We recommended continuing imaging surveillance at this time.  Should continue Keppra  250mg  BID.  May dose sumatriptan, NSAIDs or Tylenol for breakthrough migraines.  We ask that Stacy Gutierrez return to clinic in 6 months following next brain MRI, or sooner as needed.  All questions were answered. The patient knows to call the clinic with any problems, questions or concerns. No barriers to learning were detected.  The total time spent in the encounter was 40 minutes and more than 50% was on counseling and review of test results   Arthea MARLA Manns, MD Medical Director of Neuro-Oncology [facility] 10/21/2024 10:42 AM "

## 2024-10-22 ENCOUNTER — Other Ambulatory Visit: Payer: Self-pay

## 2024-11-26 ENCOUNTER — Telehealth: Payer: Self-pay | Admitting: Neurology

## 2024-11-26 NOTE — Telephone Encounter (Signed)
 That makes sense, I saw the messages in 2014 and didn't realize it had been that long. thanks!

## 2024-11-26 NOTE — Telephone Encounter (Signed)
 Pt last saw MO in 2022 that may be why she has a new pt appt and MO is still the pcp. [facility] isn't accepting new pts which may be why she is schedule with [facility].

## 2024-11-26 NOTE — Telephone Encounter (Signed)
 Patient is scheduled for a new patient appt with Stacy Gutierrez on Thursday but Stacy Gutierrez is PCP. No note in chart about transferring care? Routing to both providers.

## 2024-11-27 ENCOUNTER — Encounter: Payer: Self-pay | Admitting: Nurse Practitioner

## 2024-11-27 ENCOUNTER — Ambulatory Visit: Admitting: Nurse Practitioner

## 2024-11-27 VITALS — BP 104/72 | HR 86 | Ht 65.25 in | Wt 185.0 lb

## 2024-11-27 DIAGNOSIS — Z124 Encounter for screening for malignant neoplasm of cervix: Secondary | ICD-10-CM

## 2024-11-27 DIAGNOSIS — Z1331 Encounter for screening for depression: Secondary | ICD-10-CM

## 2024-11-27 DIAGNOSIS — Z30431 Encounter for routine checking of intrauterine contraceptive device: Secondary | ICD-10-CM

## 2024-11-27 DIAGNOSIS — Z01419 Encounter for gynecological examination (general) (routine) without abnormal findings: Secondary | ICD-10-CM

## 2024-11-27 NOTE — Progress Notes (Signed)
" ° °  Stacy Gutierrez Mar 10, 1991 [id number]   History:  34 y.o. G0 presents for annual exam. Mirena  IUD 06/2017. Amenorrheic since chemo in 2022. H/O astrocytoma. Normal pap history. Has received Gardasil series. Has surrogate due next month.   Gynecologic History No LMP recorded. (Menstrual status: IUD).   Contraception/Family planning: IUD Sexually active: Yes  Health Maintenance Last Pap: 05/25/2021. Results were: Normal Last mammogram: Not indicated Last colonoscopy: Not indicated Last Dexa: Not indicated      11/27/2024    2:07 PM  Depression screen PHQ 2/9  Decreased Interest 0  PHQ - 2 Score 0     Past medical history, past surgical history, family history and social history were all reviewed and documented in the EPIC chart. Married. Substitute teacher.  ROS:  A ROS was performed and pertinent positives and negatives are included.  Exam:  Vitals:   11/27/24 1408  BP: 104/72  Pulse: 86  SpO2: 98%  Weight: 185 lb (83.9 kg)  Height: 5' 5.25 (1.657 m)     Body mass index is 30.55 kg/m.  General appearance:  Normal Thyroid:  Symmetrical, normal in size, without palpable masses or nodularity. Respiratory  Auscultation:  Clear without wheezing or rhonchi Cardiovascular  Auscultation:  Regular rate, without rubs, murmurs or gallops  Edema/varicosities:  Not grossly evident Abdominal  Soft,nontender, without masses, guarding or rebound.  Liver/spleen:  No organomegaly noted  Hernia:  None appreciated  Skin  Inspection:  Grossly normal Breasts: Examined lying and sitting.   Right: Without masses, retractions, nipple discharge or axillary adenopathy.   Left: Without masses, retractions, nipple discharge or axillary adenopathy. Pelvic: External genitalia:  no lesions              Urethra:  normal appearing urethra with no masses, tenderness or lesions              Bartholins and Skenes: normal                 Vagina: normal appearing vagina with normal color and  discharge, no lesions              Cervix: no lesions. + IUD strings Bimanual Exam:  Uterus:  no masses or tenderness              Adnexa: no mass, fullness, tenderness              Rectovaginal: Deferred              Anus:  normal, no lesions  Clotilda Pa, CMA present as chaperone.   Assessment/Plan:  34 y.o. G0 for annual exam.   Well female exam with routine gynecological exam - Education provided on SBEs, importance of preventative screenings, current guidelines, high calcium diet, regular exercise, and multivitamin daily. Labs with PCP.   Depression screening - PHQ - 0  Cervical cancer screening - Plan: Cytology - PAP( [place]). Normal pap history.   Encounter for routine checking of intrauterine contraceptive device (IUD) - Mirena  IUD inserted 06/2017. She is aware of 8-year FDA approval. Plans to return for exchange when due in September.   Return in about 1 year (around 11/27/2025) for Annual.     Stacy DELENA Shutter DNP, 2:29 PM 11/27/2024 "

## 2024-11-28 ENCOUNTER — Encounter: Payer: Self-pay | Admitting: Family Medicine

## 2024-11-28 ENCOUNTER — Ambulatory Visit: Admitting: Family Medicine

## 2024-11-28 VITALS — BP 112/72 | HR 76 | Temp 97.7°F | Ht 65.25 in | Wt 181.0 lb

## 2024-11-28 DIAGNOSIS — Z1159 Encounter for screening for other viral diseases: Secondary | ICD-10-CM

## 2024-11-28 DIAGNOSIS — Z0000 Encounter for general adult medical examination without abnormal findings: Secondary | ICD-10-CM

## 2024-11-28 DIAGNOSIS — E782 Mixed hyperlipidemia: Secondary | ICD-10-CM | POA: Insufficient documentation

## 2024-11-28 DIAGNOSIS — C719 Malignant neoplasm of brain, unspecified: Secondary | ICD-10-CM

## 2024-11-28 DIAGNOSIS — R569 Unspecified convulsions: Secondary | ICD-10-CM

## 2024-11-28 DIAGNOSIS — Z114 Encounter for screening for human immunodeficiency virus [HIV]: Secondary | ICD-10-CM

## 2024-11-28 LAB — COMPREHENSIVE METABOLIC PANEL WITH GFR
ALT: 33 U/L (ref 3–35)
AST: 29 U/L (ref 5–37)
Albumin: 4.8 g/dL (ref 3.5–5.2)
Alkaline Phosphatase: 68 U/L (ref 39–117)
BUN: 16 mg/dL (ref 6–23)
CO2: 30 meq/L (ref 19–32)
Calcium: 9.8 mg/dL (ref 8.4–10.5)
Chloride: 101 meq/L (ref 96–112)
Creatinine, Ser: 0.68 mg/dL (ref 0.40–1.20)
GFR: 116.19 mL/min
Glucose, Bld: 94 mg/dL (ref 70–99)
Potassium: 4.1 meq/L (ref 3.5–5.1)
Sodium: 137 meq/L (ref 135–145)
Total Bilirubin: 0.4 mg/dL (ref 0.2–1.2)
Total Protein: 7.4 g/dL (ref 6.0–8.3)

## 2024-11-28 LAB — CBC WITH DIFFERENTIAL/PLATELET
Basophils Absolute: 0 10*3/uL (ref 0.0–0.1)
Basophils Relative: 0.4 % (ref 0.0–3.0)
Eosinophils Absolute: 0.1 10*3/uL (ref 0.0–0.7)
Eosinophils Relative: 2.6 % (ref 0.0–5.0)
HCT: 39.1 % (ref 36.0–46.0)
Hemoglobin: 13.3 g/dL (ref 12.0–15.0)
Lymphocytes Relative: 36.6 % (ref 12.0–46.0)
Lymphs Abs: 2 10*3/uL (ref 0.7–4.0)
MCHC: 33.9 g/dL (ref 30.0–36.0)
MCV: 87.9 fl (ref 78.0–100.0)
Monocytes Absolute: 0.3 10*3/uL (ref 0.1–1.0)
Monocytes Relative: 6.4 % (ref 3.0–12.0)
Neutro Abs: 2.9 10*3/uL (ref 1.4–7.7)
Neutrophils Relative %: 54 % (ref 43.0–77.0)
Platelets: 233 10*3/uL (ref 150.0–400.0)
RBC: 4.45 Mil/uL (ref 3.87–5.11)
RDW: 12.2 % (ref 11.5–15.5)
WBC: 5.4 10*3/uL (ref 4.0–10.5)

## 2024-11-28 LAB — CYTOLOGY - PAP
Comment: NEGATIVE
Diagnosis: NEGATIVE
High risk HPV: NEGATIVE

## 2024-11-28 LAB — LIPID PANEL
Cholesterol: 236 mg/dL — ABNORMAL HIGH (ref 28–200)
HDL: 44 mg/dL
LDL Cholesterol: 154 mg/dL — ABNORMAL HIGH (ref 10–99)
NonHDL: 191.82
Total CHOL/HDL Ratio: 5
Triglycerides: 189 mg/dL — ABNORMAL HIGH (ref 10.0–149.0)
VLDL: 37.8 mg/dL (ref 0.0–40.0)

## 2024-11-28 LAB — HEPATITIS C ANTIBODY: Hepatitis C Ab: NONREACTIVE

## 2024-11-28 LAB — TSH: TSH: 1.62 u[IU]/mL (ref 0.35–5.50)

## 2024-11-28 LAB — HIV ANTIBODY (ROUTINE TESTING W REFLEX)
HIV 1&2 Ab, 4th Generation: NONREACTIVE
HIV FINAL INTERPRETATION: NEGATIVE

## 2024-11-28 NOTE — Progress Notes (Signed)
 "   New Patient Office Visit   Subjective     Patient ID: Stacy Gutierrez, female   DOB: [birth year]  Age: 34 y.o. MRN: [id number]   CC:  Chief Complaint  Patient presents with   Establish Care      HPI Stacy Gutierrez presents to establish care. She lives with her husband. She is a lawyer. She is about to have a baby via surrogate next month.   Discussed the use of AI scribe software for clinical note transcription with the patient, who gave verbal consent to proceed.  History of Present Illness Stacy Gutierrez is a 34 year old female who presents for primary care establishment and routine blood work.  She has a history of astrocytoma, initially discovered in 2019 following a seizure. A CT scan and MRI confirmed the diagnosis, leading to a craniotomy due to the tumor's challenging location. Post-surgery, she was seizure-free for three years before the tumor began to grow again, necessitating chemotherapy and radiation in 2022.  She is on Keppra  250 mg twice daily for seizure prevention and has Ativan  2 mg for aura management, which she places under her tongue to prevent seizure progression.  Her past medical history includes a craniotomy, leg surgery, ear tubes, and tonsillectomy. She has an IUD in place and is allergic to Lamictal .  Family history is significant for her mother having bladder cancer and her father having died of a heart attack at age 54. Her maternal grandmother had diabetes and peripheral vascular disease.  Socially, she lives with her husband. She is expecting a child via surrogate, with the baby due in March. She does not use tobacco or drugs.    Seizure Disorder; Astrocytoma (craniotomy, resection with Dr. Saturnino at [facility] June 2019, started radiation/chemo 2022): - Management: Keppra  250 mg BID, PRN ativan  2 mg for aura  - Compliance: good - Followed by neuro-oncology: [facility] (every 3-4 months) and [facility] annually    Hyperlipidemia: - medications: none,  lifestyle - compliance: n/a - medication SEs: n/a The ASCVD Risk score (Arnett DK, et al., 2019) failed to calculate for the following reasons:   The 2019 ASCVD risk score is only valid for ages 88 to 15       11/28/2024   10:11 AM 11/27/2024    2:07 PM 10/21/2024   10:41 AM 10/21/2024   10:39 AM 06/18/2024   10:52 AM  Depression screen PHQ 2/9  Decreased Interest 0 0 0 0 0  Down, Depressed, Hopeless 0  0 0 0  PHQ - 2 Score 0 0 0 0 0  Altered sleeping 0      Tired, decreased energy 0      Change in appetite 0      Feeling bad or failure about yourself  0      Trouble concentrating 0      Moving slowly or fidgety/restless 0      Suicidal thoughts 0      PHQ-9 Score 0      Difficult doing work/chores Not difficult at all        ,     11/28/2024   10:12 AM  GAD 7 : Generalized Anxiety Score  Nervous, Anxious, on Edge 0  Control/stop worrying 0  Worry too much - different things 0  Trouble relaxing 0  Restless 0  Easily annoyed or irritable 0  Afraid - awful might happen 0  Total GAD 7 Score 0  Anxiety Difficulty Not difficult  at all          Show/hide medication list[1] Past Medical History:  Diagnosis Date   Astrocytoma (HCC)    Mononucleosis 01/22/2010   Seizures (HCC)    Vasovagal syncope     Past Surgical History:  Procedure Laterality Date   CRANIOTOMY Left    TIBIA FRACTURE SURGERY  03/24/2010   Tibial Fracture-left leg--plate/screw--Dr. Duwaine   TONSILLECTOMY     TYMPANOSTOMY TUBE PLACEMENT Left 08/26/2021     Family History  Problem Relation Age of Onset   Bladder Cancer Mother    Heart attack Father 19       cause of death   Diabetes type II Maternal Grandmother        heart issues   Peripheral vascular disease Maternal Grandmother    Coronary artery disease Other    Diabetes Other    Hyperlipidemia Other    Stroke Other     Social History   Socioeconomic History   Marital status: Married    Spouse name: Not on file   Number of  children: 0   Years of education: Not on file   Highest education level: Not on file  Occupational History   Occupation: Consulting Civil Engineer  Tobacco Use   Smoking status: Never   Smokeless tobacco: Never  Vaping Use   Vaping status: Never Used  Substance and Sexual Activity   Alcohol use: No   Drug use: Not Currently   Sexual activity: Yes    Partners: Male    Birth control/protection: I.U.D.  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   Social Drivers of Health   Tobacco Use: Low Risk (11/28/2024)   Patient History    Smoking Tobacco Use: Never    Smokeless Tobacco Use: Never    Passive Exposure: Not on file  Financial Resource Strain: Not on file  Food Insecurity: No Food Insecurity (10/21/2024)   Epic    Worried About Programme Researcher, Broadcasting/film/video in the Last Year: Never true    Ran Out of Food in the Last Year: Never true  Transportation Needs: No Transportation Needs (10/21/2024)   Epic    Lack of Transportation (Medical): No    Lack of Transportation (Non-Medical): No  Physical Activity: Not on file  Stress: Not on file  Social Connections: Unknown (02/21/2022)   Received from [facility]   Social Network    Social Network: Not on file  Depression (PHQ2-9): Low Risk (11/28/2024)   Depression (PHQ2-9)    PHQ-2 Score: 0  Alcohol Screen: Not on file  Housing: Low Risk (10/21/2024)   Epic    Unable to Pay for Housing in the Last Year: No    Number of Times Moved in the Last Year: 0    Homeless in the Last Year: No  Utilities: Not At Risk (10/21/2024)   Epic    Threatened with loss of utilities: No  Health Literacy: Not on file        ROS All review of systems negative except what is listed in the HPI    Objective     BP 112/72 (BP Location: Left Arm, Patient Position: Sitting, Cuff Size: Normal)   Pulse 76   Temp 97.7 F (36.5 C) (Oral)   Ht 5' 5.25 (1.657 m)   Wt  181 lb (82.1 kg)   SpO2 98%   BMI  29.89 kg/m   Physical Exam Vitals reviewed.  Constitutional:      Appearance: Normal appearance.  Cardiovascular:     Rate and Rhythm: Normal rate and regular rhythm.     Heart sounds: Normal heart sounds.  Pulmonary:     Effort: Pulmonary effort is normal.     Breath sounds: Normal breath sounds.  Skin:    General: Skin is warm and dry.  Neurological:     Mental Status: She is alert and oriented to person, place, and time.  Psychiatric:        Mood and Affect: Mood normal.        Behavior: Behavior normal.        Thought Content: Thought content normal.        Judgment: Judgment normal.           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 No recent seizures or auras. - Continue Keppra  250 mg twice daily. - Use Ativan  2 mg sublingually as needed for auras.      Astrocytoma (HCC)   Astrocytoma treated with craniotomy, radiation, and chemotherapy. Seizure prophylaxis with Keppra . No recent seizures or auras. Regular neuro-oncology follow-ups scheduled. - Continue Keppra  250 mg twice daily. - Continue regular follow-ups with neuro-oncology       Relevant Orders   CBC with Differential/Platelet   Comprehensive metabolic panel with GFR   Mixed hyperlipidemia - Primary   Previously elevated cholesterol levels. Blood pressure controlled. - Ordered lipid panel to reassess cholesterol levels.      Relevant Orders   CBC with Differential/Platelet   Comprehensive metabolic panel with GFR   Lipid panel   TSH   Other Visit Diagnoses       Encounter for medical examination to establish care         Encounter for screening for HIV       Relevant Orders   HIV Antibody (routine testing w rflx)     Encounter for hepatitis C screening test for low risk patient       Relevant Orders   Hepatitis C  antibody           Return for physical 6-12 months.  Stacy KATHEE Mon, NP  I,Stacy Gutierrez,acting as a scribe for Stacy KATHEE Mon, NP.,have documented all relevant documentation on the behalf of Stacy KATHEE Mon, NP.  I, Stacy KATHEE Mon, NP, have reviewed all documentation for this visit. The documentation on 11/28/2024 for the exam, diagnosis, procedures, and orders are all accurate and complete.      [1]  Outpatient Medications Prior to Visit  Medication Sig   levETIRAcetam  (KEPPRA ) 250 MG tablet Take 1 tablet (250 mg total) by mouth 2 (two) times daily.   levonorgestrel  (MIRENA , 52 MG,) 20 MCG/DAY IUD 1 each by Intrauterine route once for 1 dose.   LORazepam  (ATIVAN ) 2 MG tablet Take by mouth.   [DISCONTINUED] LORazepam  (ATIVAN ) 1 MG tablet Take 1 tablet (1 mg total) by mouth daily as needed.   No facility-administered medications prior to visit.   "

## 2024-11-28 NOTE — Assessment & Plan Note (Signed)
 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 No recent seizures or auras. - Continue Keppra  250 mg twice daily. - Use Ativan  2 mg sublingually as needed for auras.

## 2024-11-28 NOTE — Assessment & Plan Note (Signed)
 Astrocytoma treated with craniotomy, radiation, and chemotherapy. Seizure prophylaxis with Keppra . No recent seizures or auras. Regular neuro-oncology follow-ups scheduled. - Continue Keppra  250 mg twice daily. - Continue regular follow-ups with neuro-oncology

## 2024-11-28 NOTE — Assessment & Plan Note (Signed)
 Previously elevated cholesterol levels. Blood pressure controlled. - Ordered lipid panel to reassess cholesterol levels.

## 2024-11-29 ENCOUNTER — Ambulatory Visit: Payer: Self-pay | Admitting: Nurse Practitioner

## 2024-11-29 ENCOUNTER — Ambulatory Visit: Payer: Self-pay | Admitting: Family Medicine

## 2025-03-24 ENCOUNTER — Other Ambulatory Visit (HOSPITAL_COMMUNITY)

## 2025-03-31 ENCOUNTER — Inpatient Hospital Stay: Attending: Internal Medicine | Admitting: Internal Medicine

## 2025-12-01 ENCOUNTER — Encounter: Admitting: Family Medicine
